# Patient Record
Sex: Male | Born: 1960 | Race: White | Hispanic: No | Marital: Married | Smoking: Never smoker
Health system: Southern US, Community
[De-identification: ages and names within clinical notes are randomized; demographics above are authoritative.]

## PROBLEM LIST (undated history)

## (undated) DIAGNOSIS — M1990 Unspecified osteoarthritis, unspecified site: Secondary | ICD-10-CM

## (undated) DIAGNOSIS — I10 Essential (primary) hypertension: Secondary | ICD-10-CM

## (undated) DIAGNOSIS — B009 Herpesviral infection, unspecified: Secondary | ICD-10-CM

## (undated) HISTORY — PX: TONSILLECTOMY: SUR1361

## (undated) HISTORY — DX: Unspecified osteoarthritis, unspecified site: M19.90

## (undated) HISTORY — DX: Herpesviral infection, unspecified: B00.9

## (undated) HISTORY — PX: COLONOSCOPY: SHX174

## (undated) HISTORY — DX: Essential (primary) hypertension: I10

---

## 2013-04-27 ENCOUNTER — Other Ambulatory Visit (INDEPENDENT_AMBULATORY_CARE_PROVIDER_SITE_OTHER): Payer: 59

## 2013-04-27 DIAGNOSIS — Z0000 Encounter for general adult medical examination without abnormal findings: Secondary | ICD-10-CM

## 2013-04-27 LAB — CBC WITH DIFFERENTIAL/PLATELET
Lymphocytes Relative: 38 % (ref 12–46)
Lymphs Abs: 1.6 10*3/uL (ref 0.7–4.0)
Neutrophils Relative %: 49 % (ref 43–77)
Platelets: 205 10*3/uL (ref 150–400)
RBC: 4.75 MIL/uL (ref 4.22–5.81)
WBC: 4.3 10*3/uL (ref 4.0–10.5)

## 2013-04-27 LAB — COMPREHENSIVE METABOLIC PANEL
ALT: 17 U/L (ref 0–53)
CO2: 30 mEq/L (ref 19–32)
Calcium: 9.4 mg/dL (ref 8.4–10.5)
Chloride: 106 mEq/L (ref 96–112)
Potassium: 5.5 mEq/L — ABNORMAL HIGH (ref 3.5–5.3)
Sodium: 142 mEq/L (ref 135–145)
Total Bilirubin: 0.7 mg/dL (ref 0.3–1.2)
Total Protein: 6.7 g/dL (ref 6.0–8.3)

## 2013-04-27 LAB — PSA: PSA: 0.98 ng/mL (ref <=4.00)

## 2013-04-27 LAB — LIPID PANEL
Cholesterol: 141 mg/dL (ref 0–200)
VLDL: 11 mg/dL (ref 0–40)

## 2013-04-30 ENCOUNTER — Encounter: Payer: Self-pay | Admitting: Family Medicine

## 2013-04-30 ENCOUNTER — Ambulatory Visit (INDEPENDENT_AMBULATORY_CARE_PROVIDER_SITE_OTHER): Payer: 59 | Admitting: Family Medicine

## 2013-04-30 VITALS — BP 100/68 | HR 58 | Temp 98.2°F | Resp 12 | Ht 72.0 in | Wt 163.0 lb

## 2013-04-30 DIAGNOSIS — B009 Herpesviral infection, unspecified: Secondary | ICD-10-CM | POA: Insufficient documentation

## 2013-04-30 DIAGNOSIS — Z1211 Encounter for screening for malignant neoplasm of colon: Secondary | ICD-10-CM

## 2013-04-30 DIAGNOSIS — Z0000 Encounter for general adult medical examination without abnormal findings: Secondary | ICD-10-CM

## 2013-04-30 NOTE — Progress Notes (Signed)
Subjective:    Patient ID: Carlos [name], male    DOB: July 22, 1961, 52 y.o.   MRN: [id number]  HPI  Patient is here today for complete physical exam. He has no specific complaints. He he had his labs drawn prior to appointment. They're listed below: Lab on 04/27/2013  Component Date Value Range Status  . WBC 04/27/2013 4.3  4.0 - 10.5 K/uL Final  . RBC 04/27/2013 4.75  4.22 - 5.81 MIL/uL Final  . Hemoglobin 04/27/2013 14.7  13.0 - 17.0 g/dL Final  . HCT 40/98/1191 42.2  39.0 - 52.0 % Final  . MCV 04/27/2013 88.8  78.0 - 100.0 fL Final  . MCH 04/27/2013 30.9  26.0 - 34.0 pg Final  . MCHC 04/27/2013 34.8  30.0 - 36.0 g/dL Final  . RDW 47/82/9562 13.5  11.5 - 15.5 % Final  . Platelets 04/27/2013 205  150 - 400 K/uL Final  . Neutrophils Relative % 04/27/2013 49  43 - 77 % Final  . Neutro Abs 04/27/2013 2.1  1.7 - 7.7 K/uL Final  . Lymphocytes Relative 04/27/2013 38  12 - 46 % Final  . Lymphs Abs 04/27/2013 1.6  0.7 - 4.0 K/uL Final  . Monocytes Relative 04/27/2013 10  3 - 12 % Final  . Monocytes Absolute 04/27/2013 0.4  0.1 - 1.0 K/uL Final  . Eosinophils Relative 04/27/2013 3  0 - 5 % Final  . Eosinophils Absolute 04/27/2013 0.1  0.0 - 0.7 K/uL Final  . Basophils Relative 04/27/2013 0  0 - 1 % Final  . Basophils Absolute 04/27/2013 0.0  0.0 - 0.1 K/uL Final  . Smear Review 04/27/2013 Criteria for review not met   Final  . Sodium 04/27/2013 142  135 - 145 mEq/L Final  . Potassium 04/27/2013 5.5* 3.5 - 5.3 mEq/L Final   No visible hemolysis.  . Chloride 04/27/2013 106  96 - 112 mEq/L Final  . CO2 04/27/2013 30  19 - 32 mEq/L Final  . Glucose, Bld 04/27/2013 104* 70 - 99 mg/dL Final  . BUN 13/06/6577 13  6 - 23 mg/dL Final  . Creat 46/96/2952 0.85  0.50 - 1.35 mg/dL Final  . Total Bilirubin 04/27/2013 0.7  0.3 - 1.2 mg/dL Final  . Alkaline Phosphatase 04/27/2013 45  39 - 117 U/L Final  . AST 04/27/2013 18  0 - 37 U/L Final  . ALT 04/27/2013 17  0 - 53 U/L Final  . Total Protein  04/27/2013 6.7  6.0 - 8.3 g/dL Final  . Albumin 84/13/2440 4.4  3.5 - 5.2 g/dL Final  . Calcium 09/15/2535 9.4  8.4 - 10.5 mg/dL Final  . Cholesterol 64/40/3474 141  0 - 200 mg/dL Final   Comment: ATP III Classification:                                < 200        mg/dL        Desirable                               200 - 239     mg/dL        Borderline High                               >= 240  mg/dL        High                             . Triglycerides 04/27/2013 57  <150 mg/dL Final  . HDL 47/42/5956 53  >39 mg/dL Final  . Total CHOL/HDL Ratio 04/27/2013 2.7   Final  . VLDL 04/27/2013 11  0 - 40 mg/dL Final  . LDL Cholesterol 04/27/2013 77  0 - 99 mg/dL Final   Comment:                            Total Cholesterol/HDL Ratio:CHD Risk                                                 Coronary Heart Disease Risk Table                                                                 Men       Women                                   1/2 Average Risk              3.4        3.3                                       Average Risk              5.0        4.4                                    2X Average Risk              9.6        7.1                                    3X Average Risk             23.4       11.0                          Use the calculated Patient Ratio above and the CHD Risk table                           to determine the patient's CHD Risk.                          ATP III Classification (LDL):                                <  100        mg/dL         Optimal                               100 - 129     mg/dL         Near or Above Optimal                               130 - 159     mg/dL         Borderline High                               160 - 189     mg/dL         High                                > 190        mg/dL         Very High                             . TSH 04/27/2013 2.512  0.350 - 4.500 uIU/mL Final  . PSA 04/27/2013 0.98  <=4.00 ng/mL Final    Comment: Test Methodology: ECLIA PSA (Electrochemiluminescence Immunoassay)                                                     For PSA values from 2.5-4.0, particularly in younger men <60 years                          old, the AUA and NCCN suggest testing for % Free PSA (3515) and                          evaluation of the rate of increase in PSA (PSA velocity).   Past Medical History  Diagnosis Date  . 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   No current facility-administered medications on file prior to visit.   No Known Allergies History   Social History  . Marital Status: Married    Spouse Name: N/A    Number of Children: N/A  . Years of Education: N/A   Occupational History  . Not on file.   Social History Main Topics  . Smoking status: Never Smoker   . Smokeless tobacco: Not on file  . Alcohol Use: Yes     Comment: Occasional  . Drug Use: No  . Sexually Active: Not on file   Other Topics Concern  . Not on file   Social History Narrative  . No narrative on file   Family History  Problem Relation Age of Onset  . Diabetes Mother   . Vision loss Father   . Cancer Maternal Grandfather      Review of Systems  All other systems reviewed and are negative.  Objective:   Physical Exam  Vitals reviewed. Constitutional: He is oriented to person, place, and time. He appears well-developed and well-nourished. No distress.  HENT:  Head: Normocephalic and atraumatic.  Right Ear: External ear normal.  Left Ear: External ear normal.  Nose: Nose normal.  Mouth/Throat: Oropharynx is clear and moist. No oropharyngeal exudate.  Eyes: Conjunctivae and EOM are normal. Pupils are equal, round, and reactive to light. Right eye exhibits no discharge. Left eye exhibits no discharge. No scleral icterus.  Neck: Normal range of motion. Neck supple. No JVD present. No tracheal deviation present. No thyromegaly present.   Cardiovascular: Normal rate, regular rhythm, normal heart sounds and intact distal pulses.  Exam reveals no gallop and no friction rub.   No murmur heard. Pulmonary/Chest: Effort normal and breath sounds normal. No respiratory distress. He has no wheezes. He has no rales. He exhibits no tenderness.  Abdominal: Soft. Bowel sounds are normal. He exhibits no distension and no mass. There is no tenderness. There is no rebound and no guarding.  Genitourinary: Rectum normal, prostate normal and penis normal. No penile tenderness.  Musculoskeletal: Normal range of motion. He exhibits no edema and no tenderness.  Lymphadenopathy:    He has no cervical adenopathy.  Neurological: He is alert and oriented to person, place, and time. He has normal reflexes. He displays normal reflexes. No cranial nerve deficit. He exhibits normal muscle tone. Coordination normal.  Skin: Skin is warm and dry. No rash noted. He is not diaphoretic. No erythema. No pallor.  Psychiatric: He has a normal mood and affect. His behavior is normal. Judgment and thought content normal.          Assessment & Plan:  1. Routine general medical examination at a health care facility Patient's physical exam was completely normal. His lab work was within normal limits. He did have a small case of ringworm on his right abdomen and I recommended over-the-counter lotrimin to treat this.  Prostate exam was normal.  Anticipatory guidance was provided.  2. Screening for colon cancer Consult GI for a screening colonoscopy. - Ambulatory referral to Gastroenterology

## 2013-05-07 ENCOUNTER — Encounter: Payer: Self-pay | Admitting: Internal Medicine

## 2013-07-02 ENCOUNTER — Ambulatory Visit (AMBULATORY_SURGERY_CENTER): Payer: 59

## 2013-07-02 VITALS — Ht 72.0 in | Wt 154.6 lb

## 2013-07-02 DIAGNOSIS — Z1211 Encounter for screening for malignant neoplasm of colon: Secondary | ICD-10-CM

## 2013-07-02 MED ORDER — SUPREP BOWEL PREP KIT 17.5-3.13-1.6 GM/177ML PO SOLN: 1.0000 | Freq: Once | ORAL | Status: DC

## 2013-07-16 ENCOUNTER — Encounter: Payer: Self-pay | Admitting: Internal Medicine

## 2013-08-04 ENCOUNTER — Encounter: Payer: Self-pay | Admitting: Internal Medicine

## 2013-08-04 ENCOUNTER — Ambulatory Visit (AMBULATORY_SURGERY_CENTER): Payer: 59 | Admitting: Internal Medicine

## 2013-08-04 VITALS — BP 103/62 | HR 61 | Temp 97.9°F | Resp 51 | Ht 72.0 in | Wt 154.0 lb

## 2013-08-04 DIAGNOSIS — Z1211 Encounter for screening for malignant neoplasm of colon: Secondary | ICD-10-CM

## 2013-08-04 MED ORDER — SODIUM CHLORIDE 0.9 % IV SOLN: 500.0000 mL | INTRAVENOUS | Status: DC

## 2013-08-04 NOTE — Progress Notes (Signed)
Report to pacu rn, vss, bbs=clear 

## 2013-08-04 NOTE — Patient Instructions (Addendum)
Your colonoscopy was normal and the prep was excellent.  Next routine colonoscopy in 10 years - 2024  I appreciate the opportunity to care for you.  Iva Boop, MD, FACG  YOU HAD AN ENDOSCOPIC PROCEDURE TODAY AT THE [facility]: Refer to the procedure report that was given to you for any specific questions about what was found during the examination.  If the procedure report does not answer your questions, please call your gastroenterologist to clarify.  If you requested that your care partner not be given the details of your procedure findings, then the procedure report has been included in a sealed envelope for you to review at your convenience later.  YOU SHOULD EXPECT: Some feelings of bloating in the abdomen. Passage of more gas than usual.  Walking can help get rid of the air that was put into your GI tract during the procedure and reduce the bloating. If you had a lower endoscopy (such as a colonoscopy or flexible sigmoidoscopy) you may notice spotting of blood in your stool or on the toilet paper. If you underwent a bowel prep for your procedure, then you may not have a normal bowel movement for a few days.  DIET: Your first meal following the procedure should be a light meal and then it is ok to progress to your normal diet.  A half-sandwich or bowl of soup is an example of a good first meal.  Heavy or fried foods are harder to digest and may make you feel nauseous or bloated.  Likewise meals heavy in dairy and vegetables can cause extra gas to form and this can also increase the bloating.  Drink plenty of fluids but you should avoid alcoholic beverages for 24 hours.  ACTIVITY: Your care partner should take you home directly after the procedure.  You should plan to take it easy, moving slowly for the rest of the day.  You can resume normal activity the day after the procedure however you should NOT DRIVE or use heavy machinery for 24 hours (because of the sedation medicines  used during the test).    SYMPTOMS TO REPORT IMMEDIATELY: A gastroenterologist can be reached at any hour.  During normal business hours, 8:30 AM to 5:00 PM Monday through Friday, call [phone].  After hours and on weekends, please call the GI answering service at [phone] who will take a message and have the physician on call contact you.   Following lower endoscopy (colonoscopy or flexible sigmoidoscopy):  Excessive amounts of blood in the stool  Significant tenderness or worsening of abdominal pains  Swelling of the abdomen that is new, acute  Fever of 100F or higher  FOLLOW UP: If any biopsies were taken you will be contacted by phone or by letter within the next 1-3 weeks.  Call your gastroenterologist if you have not heard about the biopsies in 3 weeks.  Our staff will call the home number listed on your records the next business day following your procedure to check on you and address any questions or concerns that you may have at that time regarding the information given to you following your procedure. This is a courtesy call and so if there is no answer at the home number and we have not heard from you through the emergency physician on call, we will assume that you have returned to your regular daily activities without incident.  SIGNATURES/CONFIDENTIALITY: You and/or your care partner have signed paperwork which will be entered into your  electronic medical record.  These signatures attest to the fact that that the information above on your After Visit Summary has been reviewed and is understood.  Full responsibility of the confidentiality of this discharge information lies with you and/or your care-partner.

## 2013-08-04 NOTE — Op Note (Signed)
[facility] [address] [place], [address]   COLONOSCOPY PROCEDURE REPORT  PATIENT: Carlos, [name]  MR#: [id number] BIRTHDATE: 06/18/1961 , 52  yrs. old GENDER: Male ENDOSCOPIST: Iva Boop, MD, [facility] REFERRED UV:[id number] Tanya Nones, M.D. PROCEDURE DATE:  08/04/2013 PROCEDURE:   Colonoscopy, screening First Screening Colonoscopy - Avg.  risk and is 50 yrs.  old or older Yes.  Prior Negative Screening - Now for repeat screening. N/A  History of Adenoma - Now for follow-up colonoscopy & has been > or = to 3 yrs.  N/A  Polyps Removed Today? No.  Recommend repeat exam, <10 yrs? No. ASA CLASS:   Class I INDICATIONS:average risk screening and first colonoscopy. MEDICATIONS: Propofol (Diprivan) 230 mg IV, MAC sedation, administered by CRNA, and These medications were titrated to patient response per physician's verbal order  DESCRIPTION OF PROCEDURE:   After the risks benefits and alternatives of the procedure were thoroughly explained, informed consent was obtained.  A digital rectal exam revealed no abnormalities of the rectum, A digital rectal exam revealed no prostatic nodules, and A digital rectal exam revealed the prostate was not enlarged.   The LB [id number]  endoscope was introduced through the anus and advanced to the cecum, which was identified by both the appendix and ileocecal valve. No adverse events experienced.   The quality of the prep was excellent using Suprep  The instrument was then slowly withdrawn as the colon was fully examined.      COLON FINDINGS: A normal appearing cecum, ileocecal valve, and appendiceal orifice were identified.  The ascending, hepatic flexure, transverse, splenic flexure, descending, sigmoid colon and rectum appeared unremarkable.  No polyps or cancers were seen.   A right colon retroflexion was performed.  Retroflexed views revealed no abnormalities. The time to cecum=2 minutes 29 seconds. Withdrawal time=9  minutes 16 seconds.  The scope was withdrawn and the procedure completed. COMPLICATIONS: There were no complications.  ENDOSCOPIC IMPRESSION: Normal colonoscopy  RECOMMENDATIONS: Repeat routine colonscopy in 10 years - 2024. Annual hemoccults not indicated in the interim.   eSigned:  Iva Boop, MD, [facility] 08/04/2013 11:43 AM   cc: Lynnea Ferrier, MD and The Patient

## 2013-08-04 NOTE — Progress Notes (Signed)
Patient did not have preoperative order for IV antibiotic SSI prophylaxis. (G8918)  Patient did not experience any of the following events: a burn prior to discharge; a fall within the facility; wrong site/side/patient/procedure/implant event; or a hospital transfer or hospital admission upon discharge from the facility. (G8907)  

## 2013-08-05 ENCOUNTER — Telehealth: Payer: Self-pay | Admitting: *Deleted

## 2013-08-05 NOTE — Telephone Encounter (Signed)
  Follow up Call-  Call back number 08/04/2013  Post procedure Call Back phone  # [phone]  Permission to leave phone message Yes     Patient questions:  Do you have a fever, pain , or abdominal swelling? no Pain Score  0 *  Have you tolerated food without any problems? yes  Have you been able to return to your normal activities? yes  Do you have any questions about your discharge instructions: Diet   no Medications  no Follow up visit  no  Do you have questions or concerns about your Care? no  Actions: * If pain score is 4 or above: No action needed, pain <4.

## 2013-12-28 ENCOUNTER — Encounter: Payer: Self-pay | Admitting: Family Medicine

## 2013-12-28 ENCOUNTER — Ambulatory Visit (INDEPENDENT_AMBULATORY_CARE_PROVIDER_SITE_OTHER): Payer: 59 | Admitting: Family Medicine

## 2013-12-28 VITALS — BP 120/80 | HR 68 | Temp 98.3°F | Resp 18 | Ht 71.0 in | Wt 158.0 lb

## 2013-12-28 DIAGNOSIS — R202 Paresthesia of skin: Secondary | ICD-10-CM

## 2013-12-28 DIAGNOSIS — R209 Unspecified disturbances of skin sensation: Secondary | ICD-10-CM

## 2013-12-28 DIAGNOSIS — S41119A Laceration without foreign body of unspecified upper arm, initial encounter: Secondary | ICD-10-CM

## 2013-12-28 DIAGNOSIS — S41109A Unspecified open wound of unspecified upper arm, initial encounter: Secondary | ICD-10-CM

## 2013-12-28 NOTE — Assessment & Plan Note (Signed)
His sutures will need to stay in for at least 10 days. He has an appointment with orthopedics tomorrow secondary to the paresthesias in his hand and the location of the laceration. His tetanus is up to date he will complete antibiotics he does have pain medication. I will defer to work note to see when they will like to remove the sutures which we can gladly do here in the office if need

## 2013-12-28 NOTE — Progress Notes (Signed)
Patient ID: Carlos [name], male   DOB: Jul 22, 1961, 53 y.o.   MRN: [id number]     Subjective:    Patient ID: Carlos [name], male    DOB: [birth year], 53 y.o.   MRN: [id number]  Patient presents for check stitches  patient was seen by [facility] system one February 7 after he had an accident with a chainsaw. He was out cutting some one when the chainsaw caught his left arm and elbow causing a large laceration. An x-ray was done which did not show any fracture to the elbow or arm. He did have significant swelling however he was sutured at [facility] and told to follow with his PCP in orthopedics. He does have some tingling and numbness in his left hand and he has decreased range of motion in the elbow he is unable to extend completely or benf to 90 secondary to the sutures. His tetanus booster was given in may of 2013 he is currently on Bactrim antibiotics. He does have hydrocodone but has not used this however he is using the Flexeril that was given    Review Of Systems: PER ABOVE  GEN- denies fatigue, fever, weight loss,weakness, recent illness HEENT- denies eye drainage, change in vision, nasal discharge, CVS- denies chest pain, palpitations RESP- denies SOB, cough, wheeze MSK- +joint pain Neuro- denies headache, dizziness, syncope, seizure activity       Objective:    BP 120/80  Pulse 68  Temp(Src) 98.3 F (36.8 C) (Oral)  Resp 18  Ht 5\' 11"  (1.803 m)  Wt 158 lb (71.668 kg)  BMI 22.05 kg/m2 GEN- NAD, alert and oriented x3 Skin- large laceration extending from left forearm to arm- through center of elbow, +erythema, mild warmth, +swelling. MSK- decreased ROM left elbow, Normal ROM wrist/fingers, sensation grossly in tact Pulses- Radial 2+, fingers warm to touch        Assessment & Plan:      Problem List Items Addressed This Visit   None      Note: This dictation was prepared with Dragon dictation along with smaller phrase technology. Any  transcriptional errors that result from this process are unintentional.

## 2013-12-28 NOTE — Patient Instructions (Signed)
Continue current antibiotics F/u with orthopedics at 9:15am - Dr. Ninfa Linden--- [facility]

## 2013-12-28 NOTE — Assessment & Plan Note (Signed)
I think that this may be temporary do to all the swelling that is in his elbow which is probably compressing some of the nerves , this may be tincture of time to see if he has any permanent damage

## 2014-05-27 ENCOUNTER — Ambulatory Visit (INDEPENDENT_AMBULATORY_CARE_PROVIDER_SITE_OTHER): Payer: BC Managed Care – PPO | Admitting: Family Medicine

## 2014-05-27 ENCOUNTER — Other Ambulatory Visit: Payer: BC Managed Care – PPO

## 2014-05-27 ENCOUNTER — Encounter: Payer: Self-pay | Admitting: Family Medicine

## 2014-05-27 ENCOUNTER — Other Ambulatory Visit: Payer: Self-pay | Admitting: Family Medicine

## 2014-05-27 VITALS — BP 132/76 | HR 62 | Temp 97.6°F | Resp 14

## 2014-05-27 DIAGNOSIS — Z0000 Encounter for general adult medical examination without abnormal findings: Secondary | ICD-10-CM

## 2014-05-27 DIAGNOSIS — T7840XA Allergy, unspecified, initial encounter: Secondary | ICD-10-CM

## 2014-05-27 LAB — COMPREHENSIVE METABOLIC PANEL
ALK PHOS: 54 U/L (ref 39–117)
ALT: 36 U/L (ref 0–53)
AST: 31 U/L (ref 0–37)
Albumin: 4.3 g/dL (ref 3.5–5.2)
BILIRUBIN TOTAL: 0.4 mg/dL (ref 0.2–1.2)
BUN: 11 mg/dL (ref 6–23)
CO2: 28 meq/L (ref 19–32)
CREATININE: 0.79 mg/dL (ref 0.50–1.35)
Calcium: 9.2 mg/dL (ref 8.4–10.5)
Chloride: 104 mEq/L (ref 96–112)
GLUCOSE: 105 mg/dL — AB (ref 70–99)
Potassium: 5 mEq/L (ref 3.5–5.3)
SODIUM: 140 meq/L (ref 135–145)
TOTAL PROTEIN: 6.5 g/dL (ref 6.0–8.3)

## 2014-05-27 LAB — CBC WITH DIFFERENTIAL/PLATELET
BASOS PCT: 0 % (ref 0–1)
Basophils Absolute: 0 10*3/uL (ref 0.0–0.1)
EOS ABS: 0.1 10*3/uL (ref 0.0–0.7)
EOS PCT: 2 % (ref 0–5)
HCT: 43.3 % (ref 39.0–52.0)
Hemoglobin: 15.2 g/dL (ref 13.0–17.0)
Lymphocytes Relative: 27 % (ref 12–46)
Lymphs Abs: 1.5 10*3/uL (ref 0.7–4.0)
MCH: 30.7 pg (ref 26.0–34.0)
MCHC: 35.1 g/dL (ref 30.0–36.0)
MCV: 87.5 fL (ref 78.0–100.0)
Monocytes Absolute: 0.5 10*3/uL (ref 0.1–1.0)
Monocytes Relative: 9 % (ref 3–12)
NEUTROS PCT: 62 % (ref 43–77)
Neutro Abs: 3.3 10*3/uL (ref 1.7–7.7)
PLATELETS: 203 10*3/uL (ref 150–400)
RBC: 4.95 MIL/uL (ref 4.22–5.81)
RDW: 13.4 % (ref 11.5–15.5)
WBC: 5.4 10*3/uL (ref 4.0–10.5)

## 2014-05-27 LAB — LIPID PANEL
CHOL/HDL RATIO: 2.7 ratio
CHOLESTEROL: 130 mg/dL (ref 0–200)
HDL: 48 mg/dL (ref >39)
LDL CALC: 71 mg/dL (ref 0–99)
TRIGLYCERIDES: 55 mg/dL (ref <150)
VLDL: 11 mg/dL (ref 0–40)

## 2014-05-27 MED ORDER — EPINEPHRINE 0.3 MG/0.3ML IJ SOAJ: 0.3000 mg | Freq: Once | INTRAMUSCULAR | Status: DC

## 2014-05-27 MED ORDER — METHYLPREDNISOLONE ACETATE 80 MG/ML IJ SUSP
80.0000 mg | Freq: Once | INTRAMUSCULAR | Status: AC
Start: 2014-05-27 — End: 2014-05-27
  Administered 2014-05-27: 80 mg via INTRAMUSCULAR

## 2014-05-27 MED ORDER — DIPHENHYDRAMINE HCL 50 MG/ML IJ SOLN
50.0000 mg | Freq: Once | INTRAMUSCULAR | Status: AC
  Administered 2014-05-27: 50 mg via INTRAVENOUS

## 2014-05-27 MED ORDER — PREDNISONE 20 MG PO TABS: ORAL_TABLET | ORAL | Status: DC

## 2014-05-27 NOTE — Progress Notes (Signed)
   Subjective:    Patient ID: Carlos [name], male    DOB: [birth year], 53 y.o.   MRN: [id number]  HPI The patient was tunneled the dorsum of his left hand earlier this morning by a wasp.  He now has significant erythema, swelling, pitting edema on the surface of his left hand from his fingertips to his proximal wrist. The hand is grossly swollen and painful. He denies any difficulty swallowing. He denies any difficulty breathing. He is not wheezing. He denies any stridor. He is not taking any medication. Past Medical History  Diagnosis Date  . 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 Marital Status: Married    Spouse Name: N/A    Number of Children: N/A  . Years of Education: N/A   Occupational History  . Not on file.   Social History Main Topics  . Smoking status: Never Smoker   . Smokeless tobacco: Not on file  . Alcohol Use: Yes     Comment: Occasional  . Drug Use: No  . Sexual Activity: Not on file   Other Topics Concern  . Not on file   Social History Narrative  . No narrative on file     Review of Systems  All other systems reviewed and are negative.      Objective:   Physical Exam  Vitals reviewed. Constitutional: He appears well-developed and well-nourished.  Cardiovascular: Normal rate, regular rhythm and normal heart sounds.   No murmur heard. Pulmonary/Chest: Breath sounds normal. No respiratory distress. He has no wheezes. He has no rales.  Abdominal: Soft. Bowel sounds are normal. He exhibits no distension. There is no tenderness. There is no rebound.  Musculoskeletal: He exhibits edema.  Skin: Skin is warm. Rash noted. There is erythema.          Assessment & Plan:  1. Allergic reaction, initial encounter Patient received Depo-Medrol 80 mg IM x1 and Benadryl 50 mg IM x1.  I recommended he apply ice to his hand for 15 minutes 3 times a day. I recommended he take  Benadryl 25 mg every 4 hours until rash improves. Also recommend he start a prednisone taper pack tomorrow. Recheck in 24 hours if no better or immediately if worse. Also gave the patient a prescription for an EpiPen for him to have a home in case he has a anaphylactic reaction in the future. - methylPREDNISolone acetate (DEPO-MEDROL) injection 80 mg; Inject 1 mL (80 mg total) into the muscle once. - diphenhydrAMINE (BENADRYL) injection 50 mg; Inject 1 mL (50 mg total) into the vein once.

## 2014-05-28 LAB — PSA: PSA: 1.81 ng/mL (ref <=4.00)

## 2014-05-31 ENCOUNTER — Encounter: Payer: Self-pay | Admitting: Family Medicine

## 2014-05-31 ENCOUNTER — Ambulatory Visit (INDEPENDENT_AMBULATORY_CARE_PROVIDER_SITE_OTHER): Payer: BC Managed Care – PPO | Admitting: Family Medicine

## 2014-05-31 VITALS — BP 106/64 | HR 58 | Temp 97.4°F | Resp 12 | Ht 72.0 in | Wt 163.0 lb

## 2014-05-31 DIAGNOSIS — Z0000 Encounter for general adult medical examination without abnormal findings: Secondary | ICD-10-CM

## 2014-05-31 NOTE — Progress Notes (Signed)
Subjective:    Patient ID: Carlos [name], male    DOB: 1961/07/24, 53 y.o.   MRN: [id number]  HPI Patient is complete physical exam. He has no concerns. His lab work is excellent except that his PSA has doubled over the last 12 months.  He is completely asymptomatic. However his family history is concerning in that his father had prostate cancer at age 24.  Otherwise his preventive care is up to date. He had a colonoscopy last year that was completely normal. His vaccinations are up to date. His most recent labwork as listed below: Lab on 05/27/2014  Component Date Value Ref Range Status  . WBC 05/27/2014 5.4  4.0 - 10.5 K/uL Final  . RBC 05/27/2014 4.95  4.22 - 5.81 MIL/uL Final  . Hemoglobin 05/27/2014 15.2  13.0 - 17.0 g/dL Final  . HCT 05/27/2014 43.3  39.0 - 52.0 % Final  . MCV 05/27/2014 87.5  78.0 - 100.0 fL Final  . MCH 05/27/2014 30.7  26.0 - 34.0 pg Final  . MCHC 05/27/2014 35.1  30.0 - 36.0 g/dL Final  . RDW 05/27/2014 13.4  11.5 - 15.5 % Final  . Platelets 05/27/2014 203  150 - 400 K/uL Final  . Neutrophils Relative % 05/27/2014 62  43 - 77 % Final  . Neutro Abs 05/27/2014 3.3  1.7 - 7.7 K/uL Final  . Lymphocytes Relative 05/27/2014 27  12 - 46 % Final  . Lymphs Abs 05/27/2014 1.5  0.7 - 4.0 K/uL Final  . Monocytes Relative 05/27/2014 9  3 - 12 % Final  . Monocytes Absolute 05/27/2014 0.5  0.1 - 1.0 K/uL Final  . Eosinophils Relative 05/27/2014 2  0 - 5 % Final  . Eosinophils Absolute 05/27/2014 0.1  0.0 - 0.7 K/uL Final  . Basophils Relative 05/27/2014 0  0 - 1 % Final  . Basophils Absolute 05/27/2014 0.0  0.0 - 0.1 K/uL Final  . Smear Review 05/27/2014 Criteria for review not met   Final  . Sodium 05/27/2014 140  135 - 145 mEq/L Final  . Potassium 05/27/2014 5.0  3.5 - 5.3 mEq/L Final  . Chloride 05/27/2014 104  96 - 112 mEq/L Final  . CO2 05/27/2014 28  19 - 32 mEq/L Final  . Glucose, Bld 05/27/2014 105* 70 - 99 mg/dL Final  . BUN 05/27/2014 11  6 - 23 mg/dL  Final  . Creat 05/27/2014 0.79  0.50 - 1.35 mg/dL Final  . Total Bilirubin 05/27/2014 0.4  0.2 - 1.2 mg/dL Final  . Alkaline Phosphatase 05/27/2014 54  39 - 117 U/L Final  . AST 05/27/2014 31  0 - 37 U/L Final  . ALT 05/27/2014 36  0 - 53 U/L Final  . Total Protein 05/27/2014 6.5  6.0 - 8.3 g/dL Final  . Albumin 05/27/2014 4.3  3.5 - 5.2 g/dL Final  . Calcium 05/27/2014 9.2  8.4 - 10.5 mg/dL Final  . Cholesterol 05/27/2014 130  0 - 200 mg/dL Final   Comment: ATP III Classification:                                < 200        mg/dL        Desirable                               200 -  239     mg/dL        Borderline High                               >= 240        mg/dL        High                             . Triglycerides 05/27/2014 55  <150 mg/dL Final  . HDL 05/27/2014 48  >39 mg/dL Final  . Total CHOL/HDL Ratio 05/27/2014 2.7   Final  . VLDL 05/27/2014 11  0 - 40 mg/dL Final  . LDL Cholesterol 05/27/2014 71  0 - 99 mg/dL Final   Comment:                            Total Cholesterol/HDL Ratio:CHD Risk                                                 Coronary Heart Disease Risk Table                                                                 Men       Women                                   1/2 Average Risk              3.4        3.3                                       Average Risk              5.0        4.4                                    2X Average Risk              9.6        7.1                                    3X Average Risk             23.4       11.0                          Use the calculated Patient Ratio above and the CHD Risk table  to determine the patient's CHD Risk.                          ATP III Classification (LDL):                                < 100        mg/dL         Optimal                               100 - 129     mg/dL         Near or Above Optimal                               130 - 159     mg/dL          Borderline High                               160 - 189     mg/dL         High                                > 190        mg/dL         Very High                             . PSA 05/27/2014 1.81  <=4.00 ng/mL Final   Comment: Test Methodology: ECLIA PSA (Electrochemiluminescence Immunoassay)                                                     For PSA values from 2.5-4.0, particularly in younger men <60 years                          old, the AUA and NCCN suggest testing for % Free PSA (3515) and                          evaluation of the rate of increase in PSA (PSA velocity).   Past Medical History  Diagnosis Date  . 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 Tonsillectomy     Current Outpatient Prescriptions on File Prior to Visit  Medication Sig Dispense Refill  . EPINEPHrine (EPIPEN) 0.3 mg/0.3 mL IJ SOAJ injection Inject 0.3 mLs (0.3 mg total) into the muscle once.  1 Device  0  . predniSONE (DELTASONE) 20 MG tablet 3 tabs poqday 1-2, 2 tabs poqday 3-4, 1 tab poqday 5-6  12 tablet  0   No current facility-administered medications on file prior to visit.   No Known Allergies History   Social History  . Marital Status: Married    Spouse Name: N/A    Number of Children: N/A  .  Years of Education: N/A   Occupational History  . Not on file.   Social History Main Topics  . Smoking status: Never Smoker   . Smokeless tobacco: Never Used  . Alcohol Use: Yes     Comment: Occasional  . Drug Use: No  . Sexual Activity: Yes   Other Topics Concern  . Not on file   Social History Narrative  . No narrative on file   Family History  Problem Relation Age of Onset  . Diabetes Mother   . Vision loss Father   . Cancer Father 75    prostate  . Cancer Maternal Grandfather   . Colon cancer Neg Hx       Review of Systems  All other systems reviewed and are negative.      Objective:   Physical Exam  Vitals  reviewed. Constitutional: He is oriented to person, place, and time. He appears well-developed and well-nourished. No distress.  HENT:  Head: Normocephalic and atraumatic.  Right Ear: External ear normal.  Left Ear: External ear normal.  Nose: Nose normal.  Mouth/Throat: Oropharynx is clear and moist. No oropharyngeal exudate.  Eyes: Conjunctivae and EOM are normal. Pupils are equal, round, and reactive to light. Right eye exhibits no discharge. Left eye exhibits no discharge. No scleral icterus.  Neck: Normal range of motion. Neck supple. No JVD present. No tracheal deviation present. No thyromegaly present.  Cardiovascular: Normal rate, regular rhythm, normal heart sounds and intact distal pulses.  Exam reveals no gallop and no friction rub.   No murmur heard. Pulmonary/Chest: Effort normal and breath sounds normal. No stridor. No respiratory distress. He has no wheezes. He has no rales. He exhibits no tenderness.  Abdominal: Soft. Bowel sounds are normal. He exhibits no distension and no mass. There is no tenderness. There is no rebound and no guarding.  Genitourinary: Rectum normal, prostate normal and penis normal.  Musculoskeletal: Normal range of motion. He exhibits no edema and no tenderness.  Lymphadenopathy:    He has no cervical adenopathy.  Neurological: He is alert and oriented to person, place, and time. He has normal reflexes. He displays normal reflexes. No cranial nerve deficit. He exhibits normal muscle tone. Coordination normal.  Skin: Skin is warm. No rash noted. He is not diaphoretic. No erythema. No pallor.  Psychiatric: He has a normal mood and affect. His behavior is normal. Judgment and thought content normal.   prostate exam is completely normal. There is no nodularity or asymmetry        Assessment & Plan:  1. Routine general medical examination at a health care facility Physical exam is completely normal. Cancer screening is up to date. Regular anticipatory  guidance is provided. I am concerned by his family history and the fact that his PSA has doubled in one year. However his prostate exam is completely normal today. Therefore, I will recheck the patient in 3 months. At that time I will repeat a PSA and a percent free PSA.  His PSA continues to rise, I recommend a urology consultation.

## 2014-06-04 ENCOUNTER — Ambulatory Visit: Payer: BC Managed Care – PPO | Admitting: Family Medicine

## 2014-07-22 ENCOUNTER — Ambulatory Visit
Admission: RE | Admit: 2014-07-22 | Discharge: 2014-07-22 | Disposition: A | Discharge status: Home / Self Care | Payer: BC Managed Care – PPO | Source: Ambulatory Visit | Attending: Physician Assistant | Admitting: Physician Assistant

## 2014-07-22 ENCOUNTER — Encounter: Payer: Self-pay | Admitting: Family Medicine

## 2014-07-22 ENCOUNTER — Ambulatory Visit (INDEPENDENT_AMBULATORY_CARE_PROVIDER_SITE_OTHER): Payer: BC Managed Care – PPO | Admitting: Physician Assistant

## 2014-07-22 ENCOUNTER — Encounter: Payer: Self-pay | Admitting: Physician Assistant

## 2014-07-22 VITALS — BP 116/70 | HR 64 | Temp 97.9°F | Resp 12 | Ht 73.0 in | Wt 174.0 lb

## 2014-07-22 DIAGNOSIS — R0789 Other chest pain: Secondary | ICD-10-CM

## 2014-07-22 DIAGNOSIS — I208 Other forms of angina pectoris: Secondary | ICD-10-CM

## 2014-07-22 DIAGNOSIS — K219 Gastro-esophageal reflux disease without esophagitis: Secondary | ICD-10-CM

## 2014-07-22 DIAGNOSIS — R0602 Shortness of breath: Secondary | ICD-10-CM

## 2014-07-22 DIAGNOSIS — I209 Angina pectoris, unspecified: Secondary | ICD-10-CM

## 2014-07-22 MED ORDER — OMEPRAZOLE 20 MG PO CPDR: 20.0000 mg | DELAYED_RELEASE_CAPSULE | Freq: Every day | ORAL | Status: DC

## 2014-07-22 MED ORDER — NITROGLYCERIN 0.4 MG SL SUBL: 0.4000 mg | SUBLINGUAL_TABLET | SUBLINGUAL | Status: DC | PRN

## 2014-07-22 NOTE — Progress Notes (Signed)
Patient ID: Carlos [name] MRN: [id number], DOB: [birth year], 53 y.o. Date of Encounter: @DATE @  Chief Complaint:  Chief Complaint  Patient presents with  . Chest Congestion    x1 week- states that he has slight cough in the morning and has thick mucus, states he has difficulty breathing at night with congestion- also reports that he has had increased edema to BLE    HPI: 53 y.o. year old male  presents with below follwing symptoms.   When I went in the room I had read the above chief complaint. Asked him about nasal congestion/mucus and chest congestion/cough he says now he hasn't been having that.  He says that when he is lying in bed at nigh,t if he lies on his left side he feels pressure in his chest. However when lying in other positions he has not noticed the pressure in his chest.  He also says that sometimes after work he will sit on the couch with his feet propped up and will feel the pressure in his chest at that time.   He reports that this past Saturday he was in the pool swimming for about 10 minutes and then when he stopped he felt pressure in his chest and felt short of breath. Asked if he swims on a routine basis and he says that the last time prior to this past Saturday,  He had been swimming was the Saturday prior. He says that when he had been swimming the prior Saturday he felt fine. I asked what other physical exertion he does-- he says that he walks his dog for 45 minutes twice a day every day. Says over the past week while he's been doing his walking he has felt some chest pressure and shortness of breath. Says that he sometimes will make himself take a deep breath and it will relieve some of that sensation but he is able to continue his walk and has not had to stop because of the symptoms.  He also says that he has noticed some swelling in his ankles over this past week from his sock line. When I examined ankles today there is no significant edema. He makes the  comment that it is better today.   He states he has had no radiation of the discomfort up his neck or down his left arm. Had no associated diaphoresis nausea or vomiting. However, he does say that sometimes he feels like a chest tightness is going up into his neck "and feels like there is some problem in his neck as well." He has not felt or tasted acid in his throat and has not had other "heartburn symptoms."  Has Not noticed any of these symptoms occurring after eating/drinking.   Past Medical History  Diagnosis Date  . 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 EPINEPHrine (EPIPEN) 0.3 mg/0.3 mL IJ SOAJ injection Inject 0.3 mLs (0.3 mg total) into the muscle once.  1 Device  0  . predniSONE (DELTASONE) 20 MG tablet 3 tabs poqday 1-2, 2 tabs poqday 3-4, 1 tab poqday 5-6  12 tablet  0   No facility-administered medications prior to visit.    Allergies:  Allergies  Allergen Reactions  . Bee Venom     History   Social History  . Marital Status: Married    Spouse Name: N/A    Number of Children: N/A  .  Years of Education: N/A   Occupational History  . Not on file.   Social History Main Topics  . Smoking status: Never Smoker   . Smokeless tobacco: Never Used  . Alcohol Use: Yes     Comment: Occasional  . Drug Use: No  . Sexual Activity: Yes   Other Topics Concern  . Not on file   Social History Narrative  . No narrative on file    Family History  Problem Relation Age of Onset  . Diabetes Mother   . Vision loss Father   . Cancer Father 29    prostate  . Cancer Maternal Grandfather   . Colon cancer Neg Hx      Review of Systems:  See HPI for pertinent ROS. All other ROS negative.    Physical Exam: Blood pressure 116/70, pulse 64, temperature 97.9 F (36.6 C), temperature source Oral, resp. rate 12, height 6\' 1"  (1.854 m), weight 174 lb (78.926 kg)., Body mass index is 22.96  kg/(m^2). General: WNWD Male. Appears in no acute distress. Neck: Supple. No thyromegaly. No lymphadenopathy. Lungs: Clear bilaterally to auscultation without wheezes, rales, or rhonchi. Breathing is unlabored. Heart: RRR with S1 S2. No murmurs, rubs, or gallops. Abdomen: Soft, non-tender, non-distended with normoactive bowel sounds. No hepatomegaly. No rebound/guarding. No obvious abdominal masses. Musculoskeletal:  Strength and tone normal for age. There is no tenderness with palpation of the chest wall, including the peristernal area. Extremities/Skin: Warm and dry.  No edema.  Neuro: Alert and oriented X 3. Moves all extremities spontaneously. Gait is normal. CNII-XII grossly in tact. Psych:  Responds to questions appropriately with a normal affect.    EKG shows sinus bradycardia 51 beats per minutes nonspecific ST-T changes.  ASSESSMENT AND PLAN:  53 y.o. year old male with  1. Exertional angina - Ambulatory referral to Cardiology - nitroGLYCERIN (NITROSTAT) 0.4 MG SL tablet; Place 1 tablet (0.4 mg total) under the tongue every 5 (five) minutes as needed for chest pain.  Dispense: 25 tablet; Refill: 0  2. Gastroesophageal reflux disease, esophagitis presence not specified - omeprazole (PRILOSEC) 20 MG capsule; Take 1 capsule (20 mg total) by mouth daily.  Dispense: 30 capsule; Refill: 3  3. Chest pressure - DG Chest 2 View; Future - Ambulatory referral to Cardiology - omeprazole (PRILOSEC) 20 MG capsule; Take 1 capsule (20 mg total) by mouth daily.  Dispense: 30 capsule; Refill: 3 - nitroGLYCERIN (NITROSTAT) 0.4 MG SL tablet; Place 1 tablet (0.4 mg total) under the tongue every 5 (five) minutes as needed for chest pain.  Dispense: 25 tablet; Refill: 0  4. Shortness of breath - DG Chest 2 View; Future - Ambulatory referral to Cardiology - nitroGLYCERIN (NITROSTAT) 0.4 MG SL tablet; Place 1 tablet (0.4 mg total) under the tongue every 5 (five) minutes as needed for chest pain.   Dispense: 25 tablet; Refill: 0  Some of his symptoms are consistent with exertional angina and some sound like GERD or even musculoskeletal chest wall pain. However he has no tenderness with palpation of his chest wall so do not think that is the cause.  EKG shows no ischemic change.  Will obtain chest x-ray. Will obtain referral to cardiology for followup. Prescribed omeprazole for him to start taking one daily and gave him nitroglycerin to use if needed. Instructed to him proper use of the Ntg. Schedule followup office visit here for about 3 weeks to followup after the above evaluation.  Also discussed indications for going to the emergency  Room.  SignedOlean Ree Sawgrass, [place], [facility] 07/22/2014 1:34 PM

## 2014-08-23 ENCOUNTER — Ambulatory Visit (INDEPENDENT_AMBULATORY_CARE_PROVIDER_SITE_OTHER): Payer: BC Managed Care – PPO | Admitting: Internal Medicine

## 2014-08-23 ENCOUNTER — Encounter: Payer: Self-pay | Admitting: Internal Medicine

## 2014-08-23 VITALS — BP 116/80 | HR 62 | Ht 72.0 in | Wt 169.2 lb

## 2014-08-23 DIAGNOSIS — K219 Gastro-esophageal reflux disease without esophagitis: Secondary | ICD-10-CM | POA: Insufficient documentation

## 2014-08-23 DIAGNOSIS — R0789 Other chest pain: Secondary | ICD-10-CM

## 2014-08-23 NOTE — Patient Instructions (Signed)
Your physician has requested that you have an exercise tolerance test. For further information please visit [email or web address]. Please also follow instruction sheet, as given.  Your physician recommends that you schedule a follow-up appointment AS NEEDED.  We will call you with your stress test results.

## 2014-08-23 NOTE — Progress Notes (Signed)
OFFICE NOTE  Chief Complaint:  Chest pain  Primary Care Physician: Odette Fraction, MD  HPI:  Carlos [name] is a pleasant 53 year old [place] male originally from [place], [place]. He is an Recruitment consultant and recently has been having problems with chest pain. He feels like a bloating sensation which is somewhat worse after he lays down at night. He did have one episode this summer where he went swimming after eating and had some discomfort in his chest which improved when he stopped exercising. The symptoms were getting worse recently. He was seen in his primary care provider's office and prescribed omeprazole and has been taking that on a daily basis. He feels that he's had marked improvement in his symptoms and they've completely gone away. He denies however having any typical reflux symptoms such as regurgitating food, belching, burning in his throat or chest or other associated symptoms. He has been able to exercise although recently he has done less exercise than typical. He still walks and talks for about an hour almost every day. There is no significant family history of coronary disease.  PMHx:  Past Medical History  Diagnosis Date  . 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 Tonsillectomy      FAMHx:  Family History  Problem Relation Age of Onset  . Diabetes Mother   . Vision loss Father   . Cancer Father 58    prostate  . Cancer Maternal Grandfather   . Colon cancer Neg Hx     SOCHx:   reports that he has never smoked. He has never used smokeless tobacco. He reports that he drinks alcohol. He reports that he does not use illicit drugs.  ALLERGIES:  Allergies  Allergen Reactions  . Bee Venom     ROS: A comprehensive review of systems was negative except for: Cardiovascular: positive for chest pain Gastrointestinal: positive for reflux symptoms  HOME  MEDS: Current Outpatient Prescriptions  Medication Sig Dispense Refill  . EPINEPHrine (EPIPEN) 0.3 mg/0.3 mL IJ SOAJ injection Inject 0.3 mLs (0.3 mg total) into the muscle once.  1 Device  0  . nitroGLYCERIN (NITROSTAT) 0.4 MG SL tablet Place 1 tablet (0.4 mg total) under the tongue every 5 (five) minutes as needed for chest pain.  25 tablet  0  . omeprazole (PRILOSEC) 20 MG capsule Take 1 capsule (20 mg total) by mouth daily.  30 capsule  3   No current facility-administered medications for this visit.    LABS/IMAGING: No results found for this or any previous visit (from the past 48 hour(s)). No results found.  VITALS: BP 116/80  Pulse 62  Ht 6' (1.829 m)  Wt 169 lb 3.2 oz (76.749 kg)  BMI 22.94 kg/m2  EXAM: General appearance: alert and no distress Neck: no carotid bruit and no JVD Lungs: clear to auscultation bilaterally Heart: regular rate and rhythm, S1, S2 normal, no murmur, click, rub or gallop Abdomen: soft, non-tender; bowel sounds normal; no masses,  no organomegaly Extremities: extremities normal, atraumatic, no cyanosis or edema Pulses: 2+ and symmetric Skin: Skin color, texture, turgor normal. No rashes or lesions Neurologic: Grossly normal Psych: Normal  EKG: deferred  ASSESSMENT: 1. Atypical chest pain, suspect GERD  PLAN: 1.   Carlos [name] describes chest discomfort which is improved with omeprazole although he is doing less exercise. Some of it was exercise related.  He has few cardiac risk factors. I suspect that this is most likely reflux, however an exercise treadmill stress test is reasonable given his age. He is agreeable to this. We will contact him with the results of his treadmill stress test. If negative he can followup as needed.  Thank you as always for this kind referral.  Pixie Casino, MD, [facility] Attending Cardiologist [facility]  Tammy Wickliffe C 08/23/2014, 12:59 PM

## 2014-08-27 ENCOUNTER — Other Ambulatory Visit: Payer: BC Managed Care – PPO

## 2014-08-27 DIAGNOSIS — R972 Elevated prostate specific antigen [PSA]: Secondary | ICD-10-CM

## 2014-08-28 LAB — PSA: PSA: 1.08 ng/mL (ref <=4.00)

## 2014-08-28 LAB — PSA, FREE
PSA FREE: 0.5 ng/mL
PSA, Free Pct: 46 % (ref >25)

## 2014-08-31 ENCOUNTER — Encounter: Payer: Self-pay | Admitting: Family Medicine

## 2014-08-31 ENCOUNTER — Ambulatory Visit (INDEPENDENT_AMBULATORY_CARE_PROVIDER_SITE_OTHER): Payer: BC Managed Care – PPO | Admitting: Family Medicine

## 2014-08-31 VITALS — BP 104/62 | HR 62 | Temp 98.3°F | Resp 12 | Ht 72.0 in | Wt 170.0 lb

## 2014-08-31 DIAGNOSIS — R972 Elevated prostate specific antigen [PSA]: Secondary | ICD-10-CM

## 2014-08-31 NOTE — Progress Notes (Signed)
   Subjective:    Patient ID: Carlos [name], male    DOB: 09/25/1961, 53 y.o.   MRN: [id number]  HPI Patient is a very pleasant 53 year old gentleman who I last saw this summer for complete physical exam. At that time his PSA had gone from 0.98-1.8. His prostate exam was normal however his father has a history of prostate cancer and therefore, we were definitely more concerned by this drastic rise in his PSA.  He remains asymptomatic. I checked his PSA which has fallen to 1.08. Furthermore 46% and this was free PSA making prostate cancer very unlikely. The patient's pain was sure by this. Furthermore he has not had any further chest pain since this month. He continues to take omeprazole 20 mg by mouth daily. He is completely asymptomatic. He questions whether or not he needs this medication. He is scheduled for a stress test later this month. Past Medical History  Diagnosis Date  . 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 Tonsillectomy     Current Outpatient Prescriptions on File Prior to Visit  Medication Sig Dispense Refill  . EPINEPHrine (EPIPEN) 0.3 mg/0.3 mL IJ SOAJ injection Inject 0.3 mLs (0.3 mg total) into the muscle once.  1 Device  0  . nitroGLYCERIN (NITROSTAT) 0.4 MG SL tablet Place 1 tablet (0.4 mg total) under the tongue every 5 (five) minutes as needed for chest pain.  25 tablet  0  . omeprazole (PRILOSEC) 20 MG capsule Take 1 capsule (20 mg total) by mouth daily.  30 capsule  3   No current facility-administered medications on file prior to visit.   Allergies  Allergen Reactions  . Bee Venom    History   Social History  . Marital Status: Married    Spouse Name: N/A    Number of Children: N/A  . Years of Education: N/A   Occupational History  . Not on file.   Social History Main Topics  . Smoking status: Never Smoker   . Smokeless tobacco: Never Used  . Alcohol Use: Yes     Comment: Occasional  . Drug Use: No  .  Sexual Activity: Yes   Other Topics Concern  . Not on file   Social History Narrative  . No narrative on file      Review of Systems  All other systems reviewed and are negative.      Objective:   Physical Exam  Vitals reviewed. Cardiovascular: Normal rate, regular rhythm and normal heart sounds.   No murmur heard. Pulmonary/Chest: Effort normal and breath sounds normal. No respiratory distress. He has no wheezes. He has no rales.  Abdominal: Soft. Bowel sounds are normal. He exhibits no distension. There is no tenderness. There is no rebound.          Assessment & Plan:  Elevated PSA  I believe we can lengthen the patient's surveillance back to one year. I am very reassured by the fall in his PSA. I have recommended that he discontinue omeprazole and resume the medication only if his symptoms return after he discontinues the medicine.

## 2014-09-08 ENCOUNTER — Telehealth (HOSPITAL_COMMUNITY): Payer: Self-pay

## 2014-09-08 NOTE — Telephone Encounter (Signed)
Encounter complete. 

## 2014-09-10 ENCOUNTER — Ambulatory Visit (HOSPITAL_COMMUNITY)
Admission: RE | Admit: 2014-09-10 | Discharge: 2014-09-10 | Disposition: A | Discharge status: Home / Self Care | Payer: BC Managed Care – PPO | Source: Ambulatory Visit | Attending: Cardiovascular Disease | Admitting: Cardiovascular Disease

## 2014-09-10 ENCOUNTER — Telehealth (HOSPITAL_COMMUNITY): Payer: Self-pay

## 2014-09-10 DIAGNOSIS — R0789 Other chest pain: Secondary | ICD-10-CM

## 2014-09-10 DIAGNOSIS — R079 Chest pain, unspecified: Secondary | ICD-10-CM | POA: Insufficient documentation

## 2014-09-10 NOTE — Telephone Encounter (Signed)
Encounter complete. 

## 2014-09-10 NOTE — Procedures (Signed)
Exercise Treadmill Test  Pre-Exercise Testing Evaluation  NSR, normal tracing  Test  Exercise Tolerance Test Ordering MD: Pixie Casino, MD  Interpreting MD:   Unique Test No: 1  Treadmill:  1  Indication for ETT: chest pain - rule out ischemia  Contraindication to ETT: No   Stress Modality: exercise - treadmill  Cardiac Imaging Performed: non   Protocol: standard Bruce - maximal  Max BP:  176/60  Max MPHR (bpm): 168 85% MPR (bpm):  142  MPHR obtained (bpm):  169 % MPHR obtained: 100  Reached 85% MPHR (min:sec):  9:40 Total Exercise Time (min-sec): 12:08  Workload in METS:  13.60 Borg Scale:   Reason ETT Terminated:  fatigue    ST Segment Analysis At Rest: normal ST segments - no evidence of significant ST depression With Exercise: no evidence of significant ST depression  Other Information Arrhythmia:  occasional PVCs and a single ventricular couplet, rare PACs Angina during ETT:  absent (0) Quality of ETT:  diagnostic  ETT Interpretation:  normal - no evidence of ischemia by ST analysis  Comments: Good exercise tolerance. Normotensive response to exercise.  Sanda Klein, MD, [facility] [phone] office [phone] pager   **

## 2014-09-14 NOTE — Progress Notes (Signed)
LMTCB for stress test results

## 2015-08-05 ENCOUNTER — Other Ambulatory Visit: Payer: BLUE CROSS/BLUE SHIELD

## 2015-08-05 DIAGNOSIS — K219 Gastro-esophageal reflux disease without esophagitis: Secondary | ICD-10-CM

## 2015-08-05 DIAGNOSIS — Z79899 Other long term (current) drug therapy: Secondary | ICD-10-CM

## 2015-08-05 DIAGNOSIS — Z0000 Encounter for general adult medical examination without abnormal findings: Secondary | ICD-10-CM

## 2015-08-05 LAB — CBC WITH DIFFERENTIAL/PLATELET
BASOS ABS: 0 10*3/uL (ref 0.0–0.1)
Basophils Relative: 0 % (ref 0–1)
EOS ABS: 0.1 10*3/uL (ref 0.0–0.7)
Eosinophils Relative: 3 % (ref 0–5)
HEMATOCRIT: 45.4 % (ref 39.0–52.0)
Hemoglobin: 15.7 g/dL (ref 13.0–17.0)
LYMPHS ABS: 1.6 10*3/uL (ref 0.7–4.0)
Lymphocytes Relative: 33 % (ref 12–46)
MCH: 31.3 pg (ref 26.0–34.0)
MCHC: 34.6 g/dL (ref 30.0–36.0)
MCV: 90.6 fL (ref 78.0–100.0)
MPV: 9.4 fL (ref 8.6–12.4)
Monocytes Absolute: 0.4 10*3/uL (ref 0.1–1.0)
Monocytes Relative: 9 % (ref 3–12)
Neutro Abs: 2.6 10*3/uL (ref 1.7–7.7)
Neutrophils Relative %: 55 % (ref 43–77)
PLATELETS: 225 10*3/uL (ref 150–400)
RBC: 5.01 MIL/uL (ref 4.22–5.81)
RDW: 13.4 % (ref 11.5–15.5)
WBC: 4.7 10*3/uL (ref 4.0–10.5)

## 2015-08-05 LAB — LIPID PANEL
Cholesterol: 144 mg/dL (ref 125–200)
HDL: 54 mg/dL (ref >=40)
LDL Cholesterol: 81 mg/dL (ref <130)
Total CHOL/HDL Ratio: 2.7 Ratio (ref <=5.0)
Triglycerides: 46 mg/dL (ref <150)
VLDL: 9 mg/dL (ref <30)

## 2015-08-05 LAB — COMPLETE METABOLIC PANEL WITH GFR
ALT: 17 U/L (ref 9–46)
AST: 21 U/L (ref 10–35)
Albumin: 4.3 g/dL (ref 3.6–5.1)
Alkaline Phosphatase: 47 U/L (ref 40–115)
BUN: 15 mg/dL (ref 7–25)
CHLORIDE: 104 mmol/L (ref 98–110)
CO2: 27 mmol/L (ref 20–31)
CREATININE: 0.81 mg/dL (ref 0.70–1.33)
Calcium: 8.7 mg/dL (ref 8.6–10.3)
GFR, Est African American: >89 mL/min (ref >=60)
GFR, Est Non African American: >89 mL/min (ref >=60)
GLUCOSE: 100 mg/dL — AB (ref 70–99)
POTASSIUM: 4.6 mmol/L (ref 3.5–5.3)
SODIUM: 141 mmol/L (ref 135–146)
Total Bilirubin: 0.5 mg/dL (ref 0.2–1.2)
Total Protein: 6.3 g/dL (ref 6.1–8.1)

## 2015-08-06 LAB — TSH: TSH: 1.461 u[IU]/mL (ref 0.350–4.500)

## 2015-08-09 ENCOUNTER — Ambulatory Visit (INDEPENDENT_AMBULATORY_CARE_PROVIDER_SITE_OTHER): Payer: BLUE CROSS/BLUE SHIELD | Admitting: Family Medicine

## 2015-08-09 ENCOUNTER — Encounter: Payer: Self-pay | Admitting: Family Medicine

## 2015-08-09 VITALS — BP 100/62 | HR 62 | Temp 98.3°F | Resp 12 | Ht 72.0 in | Wt 168.0 lb

## 2015-08-09 DIAGNOSIS — Z0000 Encounter for general adult medical examination without abnormal findings: Secondary | ICD-10-CM

## 2015-08-09 DIAGNOSIS — Z23 Encounter for immunization: Secondary | ICD-10-CM | POA: Diagnosis not present

## 2015-08-09 NOTE — Progress Notes (Signed)
Subjective:    Patient ID: Carlos [name], male    DOB: 09-20-1961, 54 y.o.   MRN: [id number]  HPI  Patient is here for a complete physical exam. He has no concerns. We did not check a PSA yet as it has been not quit 1 year.  However digital rectal exam is performed today and is completely normal. His PSA in October 2015 was 1.08.  Otherwise his preventive care is up to date. He had a colonoscopy 2014 that was completely normal. His vaccinations are up to date. His most recent labwork as listed below: Lab on 08/05/2015  Component Date Value Ref Range Status  . Sodium 08/05/2015 141  135 - 146 mmol/L Final  . Potassium 08/05/2015 4.6  3.5 - 5.3 mmol/L Final  . Chloride 08/05/2015 104  98 - 110 mmol/L Final  . CO2 08/05/2015 27  20 - 31 mmol/L Final  . Glucose, Bld 08/05/2015 100* 70 - 99 mg/dL Final  . BUN 08/05/2015 15  7 - 25 mg/dL Final  . Creat 08/05/2015 0.81  0.70 - 1.33 mg/dL Final  . Total Bilirubin 08/05/2015 0.5  0.2 - 1.2 mg/dL Final  . Alkaline Phosphatase 08/05/2015 47  40 - 115 U/L Final  . AST 08/05/2015 21  10 - 35 U/L Final  . ALT 08/05/2015 17  9 - 46 U/L Final  . Total Protein 08/05/2015 6.3  6.1 - 8.1 g/dL Final  . Albumin 08/05/2015 4.3  3.6 - 5.1 g/dL Final  . Calcium 08/05/2015 8.7  8.6 - 10.3 mg/dL Final  . GFR, Est African American 08/05/2015 >89  >=60 mL/min Final  . GFR, Est Non African American 08/05/2015 >89  >=60 mL/min Final   Comment:   The estimated GFR is a calculation valid for adults (>=48 years old) that uses the CKD-EPI algorithm to adjust for age and sex. It is   not to be used for children, pregnant women, hospitalized patients,    patients on dialysis, or with rapidly changing kidney function. According to the NKDEP, eGFR >89 is normal, 60-89 shows mild impairment, 30-59 shows moderate impairment, 15-29 shows severe impairment and <15 is ESRD.     . TSH 08/05/2015 1.461  0.350 - 4.500 uIU/mL Final   Comment:   Footnotes:  (1) **  Please note change in unit of measure and reference range(s). **     . Cholesterol 08/05/2015 144  125 - 200 mg/dL Final  . Triglycerides 08/05/2015 46  <150 mg/dL Final  . HDL 08/05/2015 54  >=40 mg/dL Final  . Total CHOL/HDL Ratio 08/05/2015 2.7  <=5.0 Ratio Final  . VLDL 08/05/2015 9  <30 mg/dL Final  . LDL Cholesterol 08/05/2015 81  <130 mg/dL Final   Comment:   Total Cholesterol/HDL Ratio:CHD Risk                        Coronary Heart Disease Risk Table                                        Men       Women          1/2 Average Risk              3.4        3.3              Average  Risk              5.0        4.4           2X Average Risk              9.6        7.1           3X Average Risk             23.4       11.0 Use the calculated Patient Ratio above and the CHD Risk table  to determine the patient's CHD Risk.   . WBC 08/05/2015 4.7  4.0 - 10.5 K/uL Final  . RBC 08/05/2015 5.01  4.22 - 5.81 MIL/uL Final  . Hemoglobin 08/05/2015 15.7  13.0 - 17.0 g/dL Final  . HCT 08/05/2015 45.4  39.0 - 52.0 % Final  . MCV 08/05/2015 90.6  78.0 - 100.0 fL Final  . MCH 08/05/2015 31.3  26.0 - 34.0 pg Final  . MCHC 08/05/2015 34.6  30.0 - 36.0 g/dL Final  . RDW 08/05/2015 13.4  11.5 - 15.5 % Final  . Platelets 08/05/2015 225  150 - 400 K/uL Final  . MPV 08/05/2015 9.4  8.6 - 12.4 fL Final  . Neutrophils Relative % 08/05/2015 55  43 - 77 % Final  . Neutro Abs 08/05/2015 2.6  1.7 - 7.7 K/uL Final  . Lymphocytes Relative 08/05/2015 33  12 - 46 % Final  . Lymphs Abs 08/05/2015 1.6  0.7 - 4.0 K/uL Final  . Monocytes Relative 08/05/2015 9  3 - 12 % Final  . Monocytes Absolute 08/05/2015 0.4  0.1 - 1.0 K/uL Final  . Eosinophils Relative 08/05/2015 3  0 - 5 % Final  . Eosinophils Absolute 08/05/2015 0.1  0.0 - 0.7 K/uL Final  . Basophils Relative 08/05/2015 0  0 - 1 % Final  . Basophils Absolute 08/05/2015 0.0  0.0 - 0.1 K/uL Final  . Smear Review 08/05/2015 Criteria for review not met    Final   Past Medical History  Diagnosis Date  . 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 Tonsillectomy     No current outpatient prescriptions on file prior to visit.   No current facility-administered medications on file prior to visit.   Allergies  Allergen Reactions  . Bee Venom    Social History   Social History  . Marital Status: Married    Spouse Name: N/A  . Number of Children: N/A  . Years of Education: N/A   Occupational History  . Not on file.   Social History Main Topics  . Smoking status: Never Smoker   . Smokeless tobacco: Never Used  . Alcohol Use: Yes     Comment: Occasional  . Drug Use: No  . Sexual Activity: Yes   Other Topics Concern  . Not on file   Social History Narrative   Family History  Problem Relation Age of Onset  . Diabetes Mother   . Vision loss Father   . Cancer Father 71    prostate  . Cancer Maternal Grandfather   . Colon cancer Neg Hx       Review of Systems  All other systems reviewed and are negative.      Objective:   Physical Exam  Constitutional: He is oriented to person, place, and time. He appears well-developed and well-nourished. No  distress.  HENT:  Head: Normocephalic and atraumatic.  Right Ear: External ear normal.  Left Ear: External ear normal.  Nose: Nose normal.  Mouth/Throat: Oropharynx is clear and moist. No oropharyngeal exudate.  Eyes: Conjunctivae and EOM are normal. Pupils are equal, round, and reactive to light. Right eye exhibits no discharge. Left eye exhibits no discharge. No scleral icterus.  Neck: Normal range of motion. Neck supple. No JVD present. No tracheal deviation present. No thyromegaly present.  Cardiovascular: Normal rate, regular rhythm, normal heart sounds and intact distal pulses.  Exam reveals no gallop and no friction rub.   No murmur heard. Pulmonary/Chest: Effort normal and breath sounds normal. No stridor. No respiratory  distress. He has no wheezes. He has no rales. He exhibits no tenderness.  Abdominal: Soft. Bowel sounds are normal. He exhibits no distension and no mass. There is no tenderness. There is no rebound and no guarding.  Genitourinary: Rectum normal, prostate normal and penis normal.  Musculoskeletal: Normal range of motion. He exhibits no edema or tenderness.  Lymphadenopathy:    He has no cervical adenopathy.  Neurological: He is alert and oriented to person, place, and time. He has normal reflexes. No cranial nerve deficit. He exhibits normal muscle tone. Coordination normal.  Skin: Skin is warm. No rash noted. He is not diaphoretic. No erythema. No pallor.  Psychiatric: He has a normal mood and affect. His behavior is normal. Judgment and thought content normal.  Vitals reviewed.  prostate exam is completely normal. There is no nodularity or asymmetry        Assessment & Plan:  1. Routine general medical examination at a health care facility Physical exam is completely normal. Cancer screening is up to date. Regular anticipatory guidance is provided. Patient received his flu shot today. He can come in anytime after October for PSA. However I am reassured by his normal exam.

## 2015-08-09 NOTE — Addendum Note (Signed)
Addended by: Shary Decamp B on: 08/09/2015 04:46 PM   Modules accepted: Orders

## 2015-10-17 ENCOUNTER — Ambulatory Visit (INDEPENDENT_AMBULATORY_CARE_PROVIDER_SITE_OTHER): Payer: BLUE CROSS/BLUE SHIELD | Admitting: Physician Assistant

## 2015-10-17 ENCOUNTER — Encounter: Payer: Self-pay | Admitting: Physician Assistant

## 2015-10-17 VITALS — BP 122/80 | HR 68 | Temp 99.0°F | Resp 18 | Wt 177.0 lb

## 2015-10-17 DIAGNOSIS — B349 Viral infection, unspecified: Secondary | ICD-10-CM

## 2015-10-17 DIAGNOSIS — J988 Other specified respiratory disorders: Principal | ICD-10-CM

## 2015-10-17 DIAGNOSIS — B9789 Other viral agents as the cause of diseases classified elsewhere: Secondary | ICD-10-CM

## 2015-10-17 MED ORDER — HYDROCODONE-HOMATROPINE 5-1.5 MG/5ML PO SYRP: 5.0000 mL | ORAL_SOLUTION | Freq: Three times a day (TID) | ORAL | Status: DC | PRN

## 2015-10-18 NOTE — Progress Notes (Signed)
    Patient ID: Auguster Eftink MRN: [id number], DOB: 02-19-1961, 54 y.o. Date of Encounter: 10/18/2015, 11:22 AM    Chief Complaint:  Chief Complaint  Patient presents with  . sick x 1 day    fever, cough ,congestion     HPI: 54 y.o. year old white male states that symptoms just started yesterday. Says that last night he coughed a lot and was unable to get much sleep because of this. Also having runny nose. Some headache. Has had no known fever no chills. No significant sore throat or earache.     Home Meds:   No outpatient prescriptions prior to visit.   No facility-administered medications prior to visit.    Allergies:  Allergies  Allergen Reactions  . Bee Venom       Review of Systems: See HPI for pertinent ROS. All other ROS negative.    Physical Exam: Blood pressure 122/80, pulse 68, temperature 99 F (37.2 C), temperature source Oral, resp. rate 18, weight 177 lb (80.287 kg)., Body mass index is 24 kg/(m^2). General: WNWD WM. Appears in no acute distress. HEENT: Normocephalic, atraumatic, eyes without discharge, sclera non-icteric, nares are without discharge. Bilateral auditory canals clear, TM's are without perforation, pearly grey and translucent with reflective cone of light bilaterally. Oral cavity moist, posterior pharynx without exudate, erythema, peritonsillar abscess. No tenderness with percussion of frontal or maxillary sinuses.  Neck: Supple. No thyromegaly. No lymphadenopathy. Lungs: Clear bilaterally to auscultation without wheezes, rales, or rhonchi. Breathing is unlabored. Heart: Regular rhythm. No murmurs, rubs, or gallops. Msk:  Strength and tone normal for age. Extremities/Skin: Warm and dry. Neuro: Alert and oriented X 3. Moves all extremities spontaneously. Gait is normal. CNII-XII grossly in tact. Psych:  Responds to questions appropriately with a normal affect.     ASSESSMENT AND PLAN:  54 y.o. year old male with  1. Viral respiratory  infection Discussed with him that most likely this is a virus and if that is the case it should run its course and resolve on its own in 7-10 days. Told him that if symptoms worsen significantly or develops increased fever (currently 99 here in the office) or if symptoms persist greater than 7-10 days then follow-up. Will give him cough suppressant to use for symptom relief in the interim. - HYDROcodone-homatropine (HYCODAN) 5-1.5 MG/5ML syrup; Take 5 mLs by mouth every 8 (eight) hours as needed for cough.  Dispense: 120 mL; Refill: 0   Signed, [address] [place], [place], [facility] 10/18/2015 11:22 AM

## 2015-10-20 ENCOUNTER — Encounter: Payer: Self-pay | Admitting: Physician Assistant

## 2015-10-20 ENCOUNTER — Encounter: Payer: Self-pay | Admitting: Family Medicine

## 2015-10-20 ENCOUNTER — Telehealth: Payer: Self-pay | Admitting: Family Medicine

## 2015-10-20 ENCOUNTER — Ambulatory Visit (INDEPENDENT_AMBULATORY_CARE_PROVIDER_SITE_OTHER): Payer: BLUE CROSS/BLUE SHIELD | Admitting: Physician Assistant

## 2015-10-20 VITALS — BP 112/70 | HR 68 | Temp 98.3°F | Resp 18 | Wt 172.0 lb

## 2015-10-20 DIAGNOSIS — J988 Other specified respiratory disorders: Secondary | ICD-10-CM | POA: Diagnosis not present

## 2015-10-20 DIAGNOSIS — B9689 Other specified bacterial agents as the cause of diseases classified elsewhere: Principal | ICD-10-CM

## 2015-10-20 MED ORDER — AZITHROMYCIN 250 MG PO TABS: ORAL_TABLET | ORAL | Status: DC

## 2015-10-20 NOTE — Progress Notes (Signed)
    Patient ID: Carlos [name] MRN: [id number], DOB: 06/23/1961, 54 y.o. Date of Encounter: 10/20/2015, 12:06 PM    Chief Complaint:  Chief Complaint  Patient presents with  . still sick from earlier week    now with dizziness, HA, lethargic     HPI: 54 y.o. year old white male    10/17/2015: states that symptoms just started yesterday. Says that last night he coughed a lot and was unable to get much sleep because of this. Also having runny nose. Some headache. Has had no known fever no chills. No significant sore throat or earache.  At that visit .with viral respiratory infection. Gave Hycodan to use for cough suppressant.  Today 10/20/15: He states that he is continued with the basically the same symptoms but that they are just getting worse and definitely not better. He had to stay in bed yesterday because he felt so bad. Also had to stay out of work yesterday and today. Still with a lot of cough as well as some head and nasal congestion. No significant sore throat and no known fevers or chills.     Home Meds:   Outpatient Prescriptions Prior to Visit  Medication Sig Dispense Refill  . HYDROcodone-homatropine (HYCODAN) 5-1.5 MG/5ML syrup Take 5 mLs by mouth every 8 (eight) hours as needed for cough. 120 mL 0   No facility-administered medications prior to visit.    Allergies:  Allergies  Allergen Reactions  . Bee Venom       Review of Systems: See HPI for pertinent ROS. All other ROS negative.    Physical Exam: Blood pressure 112/70, pulse 68, temperature 98.3 F (36.8 C), temperature source Oral, resp. rate 18, weight 172 lb (78.019 kg)., Body mass index is 23.32 kg/(m^2). General: WNWD WM. Appears in no acute distress. HEENT: Normocephalic, atraumatic, eyes without discharge, sclera non-icteric, nares are without discharge. Bilateral auditory canals clear, TM's are without perforation, pearly grey and translucent with reflective cone of light bilaterally. Oral  cavity moist, posterior pharynx without exudate, erythema, peritonsillar abscess. No tenderness with percussion of frontal or maxillary sinuses.  Neck: Supple. No thyromegaly. No lymphadenopathy. Lungs: Clear bilaterally to auscultation without wheezes, rales, or rhonchi. Breathing is unlabored. Heart: Regular rhythm. No murmurs, rubs, or gallops. Msk:  Strength and tone normal for age. Extremities/Skin: Warm and dry. Neuro: Alert and oriented X 3. Moves all extremities spontaneously. Gait is normal. CNII-XII grossly in tact. Psych:  Responds to questions appropriately with a normal affect.     ASSESSMENT AND PLAN:  54 y.o. year old male with   1. Bacterial respiratory infection He is to take antibiotic as directed. Continue Hycodan as needed for cough suppressant. Note given to cover since Tuesday afternoon through Friday to be out of work. Return to work Monday. Follow-up if symptoms worsen or do not resolve within 1 week after completion of this antibiotic. - azithromycin (ZITHROMAX) 250 MG tablet; Day 1: Take 2 daily.  Days 2-5: Take 1 daily.  Dispense: 6 tablet; Refill: 0   Signed, [address] [place], [place], [facility] 10/20/2015 12:06 PM

## 2016-02-24 ENCOUNTER — Encounter: Payer: Self-pay | Admitting: Family Medicine

## 2016-02-24 ENCOUNTER — Other Ambulatory Visit: Payer: Self-pay | Admitting: *Deleted

## 2016-02-24 ENCOUNTER — Ambulatory Visit (HOSPITAL_COMMUNITY): Admission: RE | Admit: 2016-02-24 | Payer: Self-pay | Source: Ambulatory Visit

## 2016-02-24 ENCOUNTER — Ambulatory Visit (INDEPENDENT_AMBULATORY_CARE_PROVIDER_SITE_OTHER): Payer: BLUE CROSS/BLUE SHIELD | Admitting: Family Medicine

## 2016-02-24 ENCOUNTER — Ambulatory Visit (HOSPITAL_COMMUNITY)
Admission: RE | Admit: 2016-02-24 | Discharge: 2016-02-24 | Disposition: A | Discharge status: Home / Self Care | Payer: BLUE CROSS/BLUE SHIELD | Source: Ambulatory Visit | Attending: Family Medicine | Admitting: Family Medicine

## 2016-02-24 VITALS — BP 126/84 | HR 64 | Temp 98.7°F | Resp 18 | Wt 179.0 lb

## 2016-02-24 DIAGNOSIS — M79604 Pain in right leg: Secondary | ICD-10-CM | POA: Insufficient documentation

## 2016-02-24 DIAGNOSIS — I8391 Asymptomatic varicose veins of right lower extremity: Secondary | ICD-10-CM | POA: Diagnosis not present

## 2016-02-24 DIAGNOSIS — M7989 Other specified soft tissue disorders: Secondary | ICD-10-CM

## 2016-02-24 MED ORDER — DICLOFENAC SODIUM 75 MG PO TBEC: 75.0000 mg | DELAYED_RELEASE_TABLET | Freq: Two times a day (BID) | ORAL | Status: DC

## 2016-02-24 NOTE — Patient Instructions (Signed)
Get ultrasound done ASAP  We will call with lab results  F/U pending results

## 2016-02-24 NOTE — Progress Notes (Signed)
Patient ID: Carlos [name], male   DOB: September 11, 1961, 55 y.o.   MRN: [id number]    Subjective:    Patient ID: Carlos [name], male    DOB: [birth year], 34 y.o.   MRN: [id number]  Patient presents for swollen rt lower leg x 1 week Is here with right leg swelling for the past week. He actually returned from a trip to [place] on March 13. One week ago he was playing soccer and felt a severe cramp in his leg afterwards he had significant swelling he states that he was fine between the time that he came home from [place] until a week ago. He used ice and try to elevate but there is no change in the swelling. He denies any shortness of breath no chest pain. He did use a compression wrap yesterday which is helped when he is ambulating   Review Of Systems:  GEN- denies fatigue, fever, weight loss,weakness, recent illness HEENT- denies eye drainage, change in vision, nasal discharge, CVS- denies chest pain, palpitations RESP- denies SOB, cough, wheeze ABD- denies N/V, change in stools, abd pain GU- denies dysuria, hematuria, dribbling, incontinence MSK- denies joint pain, muscle aches, injury Neuro- denies headache, dizziness, syncope, seizure activity       Objective:    BP 126/84 mmHg  Pulse 64  Temp(Src) 98.7 F (37.1 C) (Oral)  Resp 18  Wt 179 lb (81.194 kg) GEN- NAD, alert and oriented x3 CVS- RRR, no murmur RESP-CTAB Ext- Right leg- swelling from calf to foot, +homans, TTP along calf, bruising noted on calf and foot, +varicose veins, no tender superficial veins  LLE- No swelling FROM ankle        Assessment & Plan:      Problem List Items Addressed This Visit    None    Visit Diagnoses    Leg swelling    -  Primary    Concern for DVT with recent trip, STAT US obtained and negative,? muscle injury in calf( possible tear) will start diclofenac BID,ICE, elevate, recheck next week, may need ortho     Relevant Orders    US Venous Img Lower Unilateral Right (Completed)       Note: This dictation was prepared with Dragon dictation along with smaller phrase technology. Any transcriptional errors that result from this process are unintentional.

## 2016-02-29 ENCOUNTER — Ambulatory Visit (INDEPENDENT_AMBULATORY_CARE_PROVIDER_SITE_OTHER): Payer: BLUE CROSS/BLUE SHIELD | Admitting: Family Medicine

## 2016-02-29 ENCOUNTER — Encounter: Payer: Self-pay | Admitting: Family Medicine

## 2016-02-29 VITALS — BP 122/64 | HR 62 | Temp 98.1°F | Resp 16 | Ht 72.0 in | Wt 182.0 lb

## 2016-02-29 DIAGNOSIS — S8991XD Unspecified injury of right lower leg, subsequent encounter: Secondary | ICD-10-CM

## 2016-02-29 DIAGNOSIS — S86811D Strain of other muscle(s) and tendon(s) at lower leg level, right leg, subsequent encounter: Secondary | ICD-10-CM | POA: Diagnosis not present

## 2016-02-29 NOTE — Patient Instructions (Addendum)
Take the anti-inflammatory  Ice your leg and elevate Continue with compression wrap Give note for work returning today  F/U as needed

## 2016-02-29 NOTE — Progress Notes (Signed)
Patient ID: Carlos [name], male   DOB: 03/22/1961, 55 y.o.   MRN: [id number]   Subjective:    Patient ID: Carlos [name], male    DOB: [birth year], 23 y.o.   MRN: [id number]  Patient presents for F/U Patient for recheck on his right leg swelling. He was seen last Friday secondary to swelling of his legs were weak. He has to return from [place] recently but was playing soccer when he noticed a severe cramp in his leg and then he had some bruising and swelling. I sent him for ultrasound there is no evidence of DVT or phlebitis. He was started on diclofenac 75 mg twice a day also advised ice and elevate the leg. Past 2 days his swelling started to go down. He is able to ambulate he has some pain when he walks up steps or walking up hills. But he's been using his compression wrap and this helps the most.     Review Of Systems:  GEN- denies fatigue, fever, weight loss,weakness, recent illness HEENT- denies eye drainage, change in vision, nasal discharge, CVS- denies chest pain, palpitations RESP- denies SOB, cough, wheeze ABD- denies N/V, change in stools, abd pain GU- denies dysuria, hematuria, dribbling, incontinence MSK- denies joint pain, muscle aches, injury Neuro- denies headache, dizziness, syncope, seizure activity       Objective:    BP 122/64 mmHg  Pulse 62  Temp(Src) 98.1 F (36.7 C) (Oral)  Resp 16  Ht 6' (1.829 m)  Wt 182 lb (82.555 kg)  BMI 24.68 kg/m2 GEN- NAD, alert and oriented x3 Ext- Right leg- swelling from calf to foot, +homans, TTP along calf, bruising noted on calf and foot, +varicose veins, no tender superficial veins  LLE- No swelling FROM ankle Pulse-       Assessment & Plan:      Problem List Items Addressed This Visit    None    Visit Diagnoses    Right leg injury, subsequent encounter    -  Primary    Strain of calf muscle, right, subsequent encounter        I think this is his fever strain or small tear in his calf muscle when he was playing  soccer. No evidence of DVT. He is improving with anti-inflammatories, RICE therapy Expect that this should be resolved in next 2-4 weeks. Discussed that if he has worsening pain on changes then referral to  orthopedics.        Note: This dictation was prepared with Dragon dictation along with smaller phrase technology. Any transcriptional errors that result from this process are unintentional.

## 2016-08-17 ENCOUNTER — Other Ambulatory Visit: Payer: BLUE CROSS/BLUE SHIELD

## 2016-08-17 DIAGNOSIS — Z0000 Encounter for general adult medical examination without abnormal findings: Secondary | ICD-10-CM | POA: Diagnosis not present

## 2016-08-17 DIAGNOSIS — Z125 Encounter for screening for malignant neoplasm of prostate: Secondary | ICD-10-CM | POA: Diagnosis not present

## 2016-08-17 LAB — COMPLETE METABOLIC PANEL WITH GFR
ALT: 17 U/L (ref 9–46)
AST: 21 U/L (ref 10–35)
Albumin: 4.2 g/dL (ref 3.6–5.1)
Alkaline Phosphatase: 44 U/L (ref 40–115)
BUN: 16 mg/dL (ref 7–25)
CALCIUM: 8.6 mg/dL (ref 8.6–10.3)
CHLORIDE: 105 mmol/L (ref 98–110)
CO2: 29 mmol/L (ref 20–31)
CREATININE: 0.75 mg/dL (ref 0.70–1.33)
Glucose, Bld: 103 mg/dL — ABNORMAL HIGH (ref 70–99)
POTASSIUM: 4.2 mmol/L (ref 3.5–5.3)
Sodium: 141 mmol/L (ref 135–146)
Total Bilirubin: 0.4 mg/dL (ref 0.2–1.2)
Total Protein: 6.5 g/dL (ref 6.1–8.1)

## 2016-08-17 LAB — CBC WITH DIFFERENTIAL/PLATELET
BASOS ABS: 0 {cells}/uL (ref 0–200)
BASOS PCT: 0 %
EOS ABS: 171 {cells}/uL (ref 15–500)
Eosinophils Relative: 3 %
HCT: 43.8 % (ref 38.5–50.0)
Hemoglobin: 15.2 g/dL (ref 13.0–17.0)
LYMPHS PCT: 34 %
Lymphs Abs: 1938 cells/uL (ref 850–3900)
MCH: 31 pg (ref 27.0–33.0)
MCHC: 34.7 g/dL (ref 32.0–36.0)
MCV: 89.4 fL (ref 80.0–100.0)
MONO ABS: 513 {cells}/uL (ref 200–950)
MONOS PCT: 9 %
MPV: 9.4 fL (ref 7.5–12.5)
NEUTROS PCT: 54 %
Neutro Abs: 3078 cells/uL (ref 1500–7800)
PLATELETS: 204 10*3/uL (ref 140–400)
RBC: 4.9 MIL/uL (ref 4.20–5.80)
RDW: 13.4 % (ref 11.0–15.0)
WBC: 5.7 10*3/uL (ref 3.8–10.8)

## 2016-08-17 LAB — LIPID PANEL
CHOL/HDL RATIO: 3.5 ratio (ref <=5.0)
CHOLESTEROL: 158 mg/dL (ref 125–200)
HDL: 45 mg/dL (ref >=40)
LDL Cholesterol: 98 mg/dL (ref <130)
TRIGLYCERIDES: 77 mg/dL (ref <150)
VLDL: 15 mg/dL (ref <30)

## 2016-08-17 LAB — PSA: PSA: 1.2 ng/mL (ref <=4.0)

## 2016-08-17 LAB — TSH: TSH: 1.63 m[IU]/L (ref 0.40–4.50)

## 2016-08-21 ENCOUNTER — Encounter: Payer: Self-pay | Admitting: Family Medicine

## 2016-08-21 ENCOUNTER — Ambulatory Visit (INDEPENDENT_AMBULATORY_CARE_PROVIDER_SITE_OTHER): Payer: BLUE CROSS/BLUE SHIELD | Admitting: Family Medicine

## 2016-08-21 VITALS — BP 118/76 | HR 64 | Temp 98.5°F | Resp 14 | Ht 72.0 in | Wt 189.0 lb

## 2016-08-21 DIAGNOSIS — Z23 Encounter for immunization: Secondary | ICD-10-CM | POA: Diagnosis not present

## 2016-08-21 DIAGNOSIS — Z0000 Encounter for general adult medical examination without abnormal findings: Secondary | ICD-10-CM | POA: Diagnosis not present

## 2016-08-21 NOTE — Addendum Note (Signed)
Addended by: Shary Decamp B on: 08/21/2016 03:43 PM   Modules accepted: Orders

## 2016-08-21 NOTE — Progress Notes (Signed)
Subjective:    Patient ID: Carlos [name], male    DOB: 01-Feb-1961, 55 y.o.   MRN: [id number]  HPI Patient is here for a complete physical exam. He has no concerns. He does have some mild pain in his left shoulder. Empty can sign is negative. Hawkins sign is negative. Spurling sign is negative. Reflexes are normal. I suspect that he has some mild arthritis. At the present time it is not bothersome enough that he would like to proceed with imaging of the shoulder. He would like a flu shot today. Last colonoscopy was normal in 2014. He is not due again until 2024. Prostate exam is due today. Most recent lab work as listed below Lab on 08/17/2016  Component Date Value Ref Range Status  . Sodium 08/17/2016 141  135 - 146 mmol/L Final  . Potassium 08/17/2016 4.2  3.5 - 5.3 mmol/L Final  . Chloride 08/17/2016 105  98 - 110 mmol/L Final  . CO2 08/17/2016 29  20 - 31 mmol/L Final  . Glucose, Bld 08/17/2016 103* 70 - 99 mg/dL Final  . BUN 08/17/2016 16  7 - 25 mg/dL Final  . Creat 08/17/2016 0.75  0.70 - 1.33 mg/dL Final   Comment:   For patients > or = 54 years of age: The upper reference limit for Creatinine is approximately 13% higher for people identified as African-American.     . Total Bilirubin 08/17/2016 0.4  0.2 - 1.2 mg/dL Final  . Alkaline Phosphatase 08/17/2016 44  40 - 115 U/L Final  . AST 08/17/2016 21  10 - 35 U/L Final  . ALT 08/17/2016 17  9 - 46 U/L Final  . Total Protein 08/17/2016 6.5  6.1 - 8.1 g/dL Final  . Albumin 08/17/2016 4.2  3.6 - 5.1 g/dL Final  . Calcium 08/17/2016 8.6  8.6 - 10.3 mg/dL Final  . GFR, Est African American 08/17/2016 >89  >=60 mL/min Final  . GFR, Est Non African American 08/17/2016 >89  >=60 mL/min Final  . TSH 08/17/2016 1.63  0.40 - 4.50 mIU/L Final  . Cholesterol 08/17/2016 158  125 - 200 mg/dL Final  . Triglycerides 08/17/2016 77  <150 mg/dL Final  . HDL 08/17/2016 45  >=40 mg/dL Final  . Total CHOL/HDL Ratio 08/17/2016 3.5  <=5.0 Ratio  Final  . VLDL 08/17/2016 15  <30 mg/dL Final  . LDL Cholesterol 08/17/2016 98  <130 mg/dL Final   Comment:   Total Cholesterol/HDL Ratio:CHD Risk                        Coronary Heart Disease Risk Table                                        Men       Women          1/2 Average Risk              3.4        3.3              Average Risk              5.0        4.4           2X Average Risk              9.6  7.1           3X Average Risk             23.4       11.0 Use the calculated Patient Ratio above and the CHD Risk table  to determine the patient's CHD Risk.   . WBC 08/17/2016 5.7  3.8 - 10.8 K/uL Final  . RBC 08/17/2016 4.90  4.20 - 5.80 MIL/uL Final  . Hemoglobin 08/17/2016 15.2  13.0 - 17.0 g/dL Final  . HCT 08/17/2016 43.8  38.5 - 50.0 % Final  . MCV 08/17/2016 89.4  80.0 - 100.0 fL Final  . MCH 08/17/2016 31.0  27.0 - 33.0 pg Final  . MCHC 08/17/2016 34.7  32.0 - 36.0 g/dL Final  . RDW 08/17/2016 13.4  11.0 - 15.0 % Final  . Platelets 08/17/2016 204  140 - 400 K/uL Final  . MPV 08/17/2016 9.4  7.5 - 12.5 fL Final  . Neutro Abs 08/17/2016 3078  1,500 - 7,800 cells/uL Final  . Lymphs Abs 08/17/2016 1938  850 - 3,900 cells/uL Final  . Monocytes Absolute 08/17/2016 513  200 - 950 cells/uL Final  . Eosinophils Absolute 08/17/2016 171  15 - 500 cells/uL Final  . Basophils Absolute 08/17/2016 0  0 - 200 cells/uL Final  . Neutrophils Relative % 08/17/2016 54  % Final  . Lymphocytes Relative 08/17/2016 34  % Final  . Monocytes Relative 08/17/2016 9  % Final  . Eosinophils Relative 08/17/2016 3  % Final  . Basophils Relative 08/17/2016 0  % Final  . Smear Review 08/17/2016 Criteria for review not met   Final  . PSA 08/17/2016 1.2  <=4.0 ng/mL Final   Comment:   The total PSA value from this assay system is standardized against the WHO standard. The test result will be approximately 20% lower when compared to the equimolar-standardized total PSA (Beckman  Coulter). Comparison of serial PSA results should be interpreted with this fact in mind.   This test was performed using the Siemens chemiluminescent method. Values obtained from different assay methods cannot be used interchangeably. PSA levels, regardless of value, should not be interpreted as absolute evidence of the presence or absence of disease.   Effective June 25, 2016, Total PSA is being tested on the Siemens Centaur XP using chemiluminescence methodology. Re-baseline testing will be available until September 24, 2016 at no charge. If you have a patient that may require re-baselining, please order [id number] in addition to [id number].    Past Medical History:  Diagnosis Date  . 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 TONSILLECTOMY     No current outpatient prescriptions on file prior to visit.   No current facility-administered medications on file prior to visit.    Allergies  Allergen Reactions  . Bee Venom    Social History   Social History  . Marital status: Married    Spouse name: N/A  . Number of children: N/A  . Years of education: N/A   Occupational History  . Not on file.   Social History Main Topics  . Smoking status: Never Smoker  . Smokeless tobacco: Never Used  . Alcohol use Yes     Comment: Occasional  . Drug use: No  . Sexual activity: Yes   Other Topics Concern  . Not on file   Social History Narrative  . No narrative on file   Family History  Problem  Relation Age of Onset  . Diabetes Mother   . Vision loss Father   . Cancer Father 6    prostate  . Cancer Maternal Grandfather   . Colon cancer Neg Hx       Review of Systems  All other systems reviewed and are negative.      Objective:   Physical Exam  Constitutional: He is oriented to person, place, and time. He appears well-developed and well-nourished. No distress.  HENT:  Head: Normocephalic and atraumatic.  Right Ear: External  ear normal.  Left Ear: External ear normal.  Nose: Nose normal.  Mouth/Throat: Oropharynx is clear and moist. No oropharyngeal exudate.  Eyes: Conjunctivae and EOM are normal. Pupils are equal, round, and reactive to light. Right eye exhibits no discharge. Left eye exhibits no discharge. No scleral icterus.  Neck: Normal range of motion. Neck supple. No JVD present. No tracheal deviation present. No thyromegaly present.  Cardiovascular: Normal rate, regular rhythm, normal heart sounds and intact distal pulses.  Exam reveals no gallop and no friction rub.   No murmur heard. Pulmonary/Chest: Effort normal and breath sounds normal. No stridor. No respiratory distress. He has no wheezes. He has no rales. He exhibits no tenderness.  Abdominal: Soft. Bowel sounds are normal. He exhibits no distension and no mass. There is no tenderness. There is no rebound and no guarding.  Genitourinary: Rectum normal, prostate normal and penis normal.  Musculoskeletal: Normal range of motion. He exhibits no edema or tenderness.  Lymphadenopathy:    He has no cervical adenopathy.  Neurological: He is alert and oriented to person, place, and time. He has normal reflexes. No cranial nerve deficit. He exhibits normal muscle tone. Coordination normal.  Skin: Skin is warm. No rash noted. He is not diaphoretic. No erythema. No pallor.  Psychiatric: He has a normal mood and affect. His behavior is normal. Judgment and thought content normal.  Vitals reviewed.  prostate exam is completely normal. There is no nodularity or asymmetry        Assessment & Plan:  1. Routine general medical examination at a health care facility Physical exam today is completely normal. Patient will receive his flu shot. Colonoscopy is up-to-date. Prostate exam is normal as is his PSA.  His lab work is excellent aside from a slight elevation in his sugar. Regular anticipatory guidance is provided

## 2016-10-29 ENCOUNTER — Ambulatory Visit: Payer: BLUE CROSS/BLUE SHIELD | Admitting: Family Medicine

## 2016-11-01 DIAGNOSIS — M2241 Chondromalacia patellae, right knee: Secondary | ICD-10-CM | POA: Diagnosis not present

## 2017-09-02 ENCOUNTER — Encounter: Payer: BLUE CROSS/BLUE SHIELD | Admitting: Family Medicine

## 2017-09-11 ENCOUNTER — Other Ambulatory Visit: Payer: BLUE CROSS/BLUE SHIELD

## 2017-09-11 ENCOUNTER — Other Ambulatory Visit: Payer: Self-pay | Admitting: Family Medicine

## 2017-09-11 DIAGNOSIS — Z125 Encounter for screening for malignant neoplasm of prostate: Secondary | ICD-10-CM | POA: Diagnosis not present

## 2017-09-11 DIAGNOSIS — Z0000 Encounter for general adult medical examination without abnormal findings: Secondary | ICD-10-CM | POA: Diagnosis not present

## 2017-09-12 LAB — CBC WITH DIFFERENTIAL/PLATELET
BASOS ABS: 39 {cells}/uL (ref 0–200)
Basophils Relative: 0.7 %
EOS ABS: 171 {cells}/uL (ref 15–500)
Eosinophils Relative: 3.1 %
HCT: 44 % (ref 38.5–50.0)
HEMOGLOBIN: 15.2 g/dL (ref 13.2–17.1)
Lymphs Abs: 1843 cells/uL (ref 850–3900)
MCH: 30.3 pg (ref 27.0–33.0)
MCHC: 34.5 g/dL (ref 32.0–36.0)
MCV: 87.8 fL (ref 80.0–100.0)
MONOS PCT: 10.1 %
MPV: 9.4 fL (ref 7.5–12.5)
NEUTROS ABS: 2893 {cells}/uL (ref 1500–7800)
Neutrophils Relative %: 52.6 %
Platelets: 245 10*3/uL (ref 140–400)
RBC: 5.01 10*6/uL (ref 4.20–5.80)
RDW: 12.7 % (ref 11.0–15.0)
Total Lymphocyte: 33.5 %
WBC: 5.5 10*3/uL (ref 3.8–10.8)
WBCMIX: 556 {cells}/uL (ref 200–950)

## 2017-09-12 LAB — COMPLETE METABOLIC PANEL WITH GFR
AG Ratio: 1.8 (calc) (ref 1.0–2.5)
ALKALINE PHOSPHATASE (APISO): 45 U/L (ref 40–115)
ALT: 18 U/L (ref 9–46)
AST: 17 U/L (ref 10–35)
Albumin: 4.1 g/dL (ref 3.6–5.1)
BUN: 15 mg/dL (ref 7–25)
CALCIUM: 8.9 mg/dL (ref 8.6–10.3)
CO2: 30 mmol/L (ref 20–32)
CREATININE: 0.83 mg/dL (ref 0.70–1.33)
Chloride: 102 mmol/L (ref 98–110)
GFR, EST NON AFRICAN AMERICAN: 99 mL/min/{1.73_m2} (ref >=60)
GFR, Est African American: 115 mL/min/{1.73_m2} (ref >=60)
GLOBULIN: 2.3 g/dL (ref 1.9–3.7)
GLUCOSE: 101 mg/dL — AB (ref 65–99)
Potassium: 4.3 mmol/L (ref 3.5–5.3)
SODIUM: 139 mmol/L (ref 135–146)
Total Bilirubin: 0.4 mg/dL (ref 0.2–1.2)
Total Protein: 6.4 g/dL (ref 6.1–8.1)

## 2017-09-12 LAB — LIPID PANEL
CHOL/HDL RATIO: 3.7 (calc) (ref <5.0)
CHOLESTEROL: 187 mg/dL (ref <200)
HDL: 50 mg/dL (ref >40)
LDL CHOLESTEROL (CALC): 111 mg/dL — AB
NON-HDL CHOLESTEROL (CALC): 137 mg/dL — AB (ref <130)
Triglycerides: 145 mg/dL (ref <150)

## 2017-09-12 LAB — PSA: PSA: 1.6 ng/mL (ref <=4.0)

## 2017-09-16 ENCOUNTER — Encounter: Payer: Self-pay | Admitting: Family Medicine

## 2017-09-16 ENCOUNTER — Ambulatory Visit (INDEPENDENT_AMBULATORY_CARE_PROVIDER_SITE_OTHER): Payer: BLUE CROSS/BLUE SHIELD | Admitting: Family Medicine

## 2017-09-16 VITALS — BP 126/82 | HR 66 | Temp 98.0°F | Resp 14 | Ht 72.0 in | Wt 190.0 lb

## 2017-09-16 DIAGNOSIS — Z0000 Encounter for general adult medical examination without abnormal findings: Secondary | ICD-10-CM | POA: Diagnosis not present

## 2017-09-16 DIAGNOSIS — Z23 Encounter for immunization: Secondary | ICD-10-CM

## 2017-09-16 MED ORDER — MELOXICAM 15 MG PO TABS: 15.0000 mg | ORAL_TABLET | Freq: Every day | ORAL | 3 refills | Status: DC

## 2017-09-16 NOTE — Progress Notes (Signed)
Subjective:    Patient ID: Carlos [name], male    DOB: [birth year], 56 y.o.   MRN: [id number]  HPI Patient is here for a complete physical exam.he reports pain over the lateral epicondyle in his left elbow.  Pain is made worse with gripping action of the hand and with dorsiflexion of the left wrist.  Pain is consistent with lateral epicondylitis.  He denies any injury to the elbow.  There is no effusion or crepitus in the elbow.  He also has pain in the right heel near the insertion of the plantar fascia.  Pain is worse with his first few steps in the morning.  It then improves after stretching his foot.  It feels like a sharp stabbing pain in the heel.  He would like a flu shot today. Last colonoscopy was normal in 2014. He is not due again until 2024. Prostate exam is due today. Most recent lab work as listed below Appointment on 09/11/2017  Component Date Value Ref Range Status  . PSA 09/11/2017 1.6  < OR = 4.0 ng/mL Final   Comment: The total PSA value from this assay system is  standardized against the WHO standard. The test  result will be approximately 20% lower when compared  to the equimolar-standardized total PSA (Beckman  Coulter). Comparison of serial PSA results should be  interpreted with this fact in mind. . This test was performed using the Siemens  chemiluminescent method. Values obtained from  different assay methods cannot be used interchangeably. PSA levels, regardless of value, should not be interpreted as absolute evidence of the presence or absence of disease.   . WBC 09/11/2017 5.5  3.8 - 10.8 Thousand/uL Final  . RBC 09/11/2017 5.01  4.20 - 5.80 Million/uL Final  . Hemoglobin 09/11/2017 15.2  13.2 - 17.1 g/dL Final  . HCT 09/11/2017 44.0  38.5 - 50.0 % Final  . MCV 09/11/2017 87.8  80.0 - 100.0 fL Final  . MCH 09/11/2017 30.3  27.0 - 33.0 pg Final  . MCHC 09/11/2017 34.5  32.0 - 36.0 g/dL Final  . RDW 09/11/2017 12.7  11.0 - 15.0 % Final  . Platelets  09/11/2017 245  140 - 400 Thousand/uL Final  . MPV 09/11/2017 9.4  7.5 - 12.5 fL Final  . Neutro Abs 09/11/2017 2893  1,500 - 7,800 cells/uL Final  . Lymphs Abs 09/11/2017 1843  850 - 3,900 cells/uL Final  . WBC mixed population 09/11/2017 556  200 - 950 cells/uL Final  . Eosinophils Absolute 09/11/2017 171  15 - 500 cells/uL Final  . Basophils Absolute 09/11/2017 39  0 - 200 cells/uL Final  . Neutrophils Relative % 09/11/2017 52.6  % Final  . Total Lymphocyte 09/11/2017 33.5  % Final  . Monocytes Relative 09/11/2017 10.1  % Final  . Eosinophils Relative 09/11/2017 3.1  % Final  . Basophils Relative 09/11/2017 0.7  % Final  . Cholesterol 09/11/2017 187  <200 mg/dL Final  . HDL 09/11/2017 50  >40 mg/dL Final  . Triglycerides 09/11/2017 145  <150 mg/dL Final  . LDL Cholesterol (Calc) 09/11/2017 111* mg/dL (calc) Final   Comment: Reference range: <100 . Desirable range <100 mg/dL for primary prevention;   <70 mg/dL for patients with CHD or diabetic patients  with > or = 2 CHD risk factors. Marland Kitchen LDL-C is now calculated using the Martin-Hopkins  calculation, which is a validated novel method providing  better accuracy than the Friedewald equation in the  estimation of LDL-C.  Cresenciano Genre et al. Annamaria Helling. 6073;710(62): 2061-2068  (http://education.[facility].com/faq/FAQ164)   . Total CHOL/HDL Ratio 09/11/2017 3.7  <5.0 (calc) Final  . Non-HDL Cholesterol (Calc) 09/11/2017 137* <130 mg/dL (calc) Final   Comment: For patients with diabetes plus 1 major ASCVD risk  factor, treating to a non-HDL-C goal of <100 mg/dL  (LDL-C of <70 mg/dL) is considered a therapeutic  option.   . Glucose, Bld 09/11/2017 101* 65 - 99 mg/dL Final   Comment: .            Fasting reference interval . For someone without known diabetes, a glucose value between 100 and 125 mg/dL is consistent with prediabetes and should be confirmed with a follow-up test. .   . BUN 09/11/2017 15  7 - 25 mg/dL Final  . Creat  09/11/2017 0.83  0.70 - 1.33 mg/dL Final   Comment: For patients >23 years of age, the reference limit for Creatinine is approximately 13% higher for people identified as African-American. .   . GFR, Est Non African American 09/11/2017 99  > OR = 60 mL/min/1.16m2 Final  . GFR, Est African American 09/11/2017 115  > OR = 60 mL/min/1.5m2 Final  . BUN/Creatinine Ratio 69/48/5462 NOT APPLICABLE  6 - 22 (calc) Final  . Sodium 09/11/2017 139  135 - 146 mmol/L Final  . Potassium 09/11/2017 4.3  3.5 - 5.3 mmol/L Final  . Chloride 09/11/2017 102  98 - 110 mmol/L Final  . CO2 09/11/2017 30  20 - 32 mmol/L Final  . Calcium 09/11/2017 8.9  8.6 - 10.3 mg/dL Final  . Total Protein 09/11/2017 6.4  6.1 - 8.1 g/dL Final  . Albumin 09/11/2017 4.1  3.6 - 5.1 g/dL Final  . Globulin 09/11/2017 2.3  1.9 - 3.7 g/dL (calc) Final  . AG Ratio 09/11/2017 1.8  1.0 - 2.5 (calc) Final  . Total Bilirubin 09/11/2017 0.4  0.2 - 1.2 mg/dL Final  . Alkaline phosphatase (APISO) 09/11/2017 45  40 - 115 U/L Final  . AST 09/11/2017 17  10 - 35 U/L Final  . ALT 09/11/2017 18  9 - 46 U/L Final   Past Medical History:  Diagnosis Date  . 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 TONSILLECTOMY     No current outpatient prescriptions on file prior to visit.   No current facility-administered medications on file prior to visit.    Allergies  Allergen Reactions  . Bee Venom    Social History   Social History  . Marital status: Married    Spouse name: N/A  . Number of children: N/A  . Years of education: N/A   Occupational History  . Not on file.   Social History Main Topics  . Smoking status: Never Smoker  . Smokeless tobacco: Never Used  . Alcohol use Yes     Comment: Occasional  . Drug use: No  . Sexual activity: Yes   Other Topics Concern  . Not on file   Social History Narrative  . No narrative on file   Family History  Problem Relation Age of Onset  .  Diabetes Mother   . Vision loss Father   . Cancer Father 62       prostate  . Cancer Maternal Grandfather   . Colon cancer Neg Hx       Review of Systems  All other systems reviewed and are negative.      Objective:  Physical Exam  Constitutional: He is oriented to person, place, and time. He appears well-developed and well-nourished. No distress.  HENT:  Head: Normocephalic and atraumatic.  Right Ear: External ear normal.  Left Ear: External ear normal.  Nose: Nose normal.  Mouth/Throat: Oropharynx is clear and moist. No oropharyngeal exudate.  Eyes: Pupils are equal, round, and reactive to light. Conjunctivae and EOM are normal. Right eye exhibits no discharge. Left eye exhibits no discharge. No scleral icterus.  Neck: Normal range of motion. Neck supple. No JVD present. No tracheal deviation present. No thyromegaly present.  Cardiovascular: Normal rate, regular rhythm, normal heart sounds and intact distal pulses.  Exam reveals no gallop and no friction rub.   No murmur heard. Pulmonary/Chest: Effort normal and breath sounds normal. No stridor. No respiratory distress. He has no wheezes. He has no rales. He exhibits no tenderness.  Abdominal: Soft. Bowel sounds are normal. He exhibits no distension and no mass. There is no tenderness. There is no rebound and no guarding.  Genitourinary: Rectum normal, prostate normal and penis normal.  Musculoskeletal: Normal range of motion. He exhibits no edema or tenderness.  Lymphadenopathy:    He has no cervical adenopathy.  Neurological: He is alert and oriented to person, place, and time. He has normal reflexes. No cranial nerve deficit. He exhibits normal muscle tone. Coordination normal.  Skin: Skin is warm. No rash noted. He is not diaphoretic. No erythema. No pallor.  Psychiatric: He has a normal mood and affect. His behavior is normal. Judgment and thought content normal.  Vitals reviewed.  prostate exam is completely normal.  There is no nodularity or asymmetry        Assessment & Plan:  1. Routine general medical examination at a health care facility Physical exam today is completely normal. Patient will receive his flu shot. Colonoscopy is up-to-date. Prostate exam is normal as is his PSA.  His lab work is excellent aside from a slight elevation in his sugar. Regular anticipatory guidance is provided  I believe he has lateral epicondylitis.  I recommended an elbow strap as well as a wrist splint to prevent dorsiflexion and rest.  He can also use meloxicam 15 mg a day.  If not improving, the next that would be a cortisone injection.  Also believe he has plantar fasciitis in the right heel.  I recommended stretches that he can perform as well as meloxicam 15 mg a day.  If not improving, he can return for a cortisone injection there as well

## 2017-11-08 ENCOUNTER — Other Ambulatory Visit: Payer: Self-pay | Admitting: Family Medicine

## 2017-11-08 MED ORDER — MELOXICAM 15 MG PO TABS: 15.0000 mg | ORAL_TABLET | Freq: Every day | ORAL | 3 refills | Status: DC

## 2017-12-04 DIAGNOSIS — M25562 Pain in left knee: Secondary | ICD-10-CM | POA: Diagnosis not present

## 2017-12-04 DIAGNOSIS — M2241 Chondromalacia patellae, right knee: Secondary | ICD-10-CM | POA: Diagnosis not present

## 2018-03-24 ENCOUNTER — Ambulatory Visit (INDEPENDENT_AMBULATORY_CARE_PROVIDER_SITE_OTHER): Payer: BLUE CROSS/BLUE SHIELD | Admitting: Family Medicine

## 2018-03-24 ENCOUNTER — Encounter: Payer: Self-pay | Admitting: Family Medicine

## 2018-03-24 VITALS — BP 118/80 | HR 60 | Temp 98.0°F | Resp 12 | Ht 72.0 in | Wt 179.0 lb

## 2018-03-24 DIAGNOSIS — B079 Viral wart, unspecified: Secondary | ICD-10-CM | POA: Diagnosis not present

## 2018-03-24 NOTE — Progress Notes (Signed)
   Subjective:    Patient ID: Carlos [name], male    DOB: 05/17/1961, 57 y.o.   MRN: [id number]  HPI  Patient presents with a new growth on his left rib.  It is 4 to 5 mm in diameter.  It is a well-circumscribed wartlike papule with scale.  He states is been growing over the last month.  He would like it treated or removed.  We discussed options including cryotherapy versus excision.  Patient would like to try cryotherapy.  Past Medical History:  Diagnosis Date  . 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 TONSILLECTOMY     No current outpatient medications on file prior to visit.   No current facility-administered medications on file prior to visit.    Allergies  Allergen Reactions  . Bee Venom    Social History   Socioeconomic History  . Marital status: Married    Spouse name: Not on file  . Number of children: Not on file  . Years of education: Not on file  . Highest education level: Not on file  Occupational History  . Not on file  Social Needs  . Financial resource strain: Not on file  . Food insecurity:    Worry: Not on file    Inability: Not on file  . Transportation needs:    Medical: Not on file    Non-medical: Not on file  Tobacco Use  . Smoking status: Never Smoker  . Smokeless tobacco: Never Used  Substance and Sexual Activity  . Alcohol use: Yes    Comment: Occasional  . Drug use: No  . Sexual activity: Yes  Lifestyle  . Physical activity:    Days per week: Not on file    Minutes per session: Not on file  . Stress: Not on file  Relationships  . Social connections:    Talks on phone: Not on file    Gets together: Not on file    Attends religious service: Not on file    Active member of club or organization: Not on file    Attends meetings of clubs or organizations: Not on file    Relationship status: Not on file  . Intimate partner violence:    Fear of current or ex partner: Not on file   Emotionally abused: Not on file    Physically abused: Not on file    Forced sexual activity: Not on file  Other Topics Concern  . Not on file  Social History Narrative  . Not on file     Review of Systems  All other systems reviewed and are negative.      Objective:   Physical Exam  Constitutional: He appears well-developed and well-nourished.  Cardiovascular: Normal rate and regular rhythm.  Pulmonary/Chest: Effort normal and breath sounds normal.  Abdominal:    Vitals reviewed.         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  I have very little suspicion about skin cancer.  I recommended cryotherapy.  Lesion was treated with liquid nitrogen cryotherapy for a total of 30 seconds.  Patient tolerated the procedure well without complication.  Wound care was discussed.  If lesion persists, we could perform an excisional biopsy.

## 2018-08-01 ENCOUNTER — Ambulatory Visit (INDEPENDENT_AMBULATORY_CARE_PROVIDER_SITE_OTHER): Payer: BLUE CROSS/BLUE SHIELD | Admitting: Family Medicine

## 2018-08-01 ENCOUNTER — Encounter: Payer: Self-pay | Admitting: Family Medicine

## 2018-08-01 ENCOUNTER — Ambulatory Visit (HOSPITAL_COMMUNITY)
Admission: RE | Admit: 2018-08-01 | Discharge: 2018-08-01 | Disposition: A | Discharge status: Home / Self Care | Payer: BLUE CROSS/BLUE SHIELD | Source: Ambulatory Visit | Attending: Family Medicine | Admitting: Family Medicine

## 2018-08-01 ENCOUNTER — Other Ambulatory Visit: Payer: Self-pay

## 2018-08-01 VITALS — BP 122/64 | HR 70 | Temp 98.6°F | Resp 14 | Ht 72.0 in | Wt 165.0 lb

## 2018-08-01 DIAGNOSIS — M47812 Spondylosis without myelopathy or radiculopathy, cervical region: Secondary | ICD-10-CM | POA: Insufficient documentation

## 2018-08-01 DIAGNOSIS — M542 Cervicalgia: Secondary | ICD-10-CM

## 2018-08-01 DIAGNOSIS — G44209 Tension-type headache, unspecified, not intractable: Secondary | ICD-10-CM | POA: Diagnosis not present

## 2018-08-01 DIAGNOSIS — J019 Acute sinusitis, unspecified: Secondary | ICD-10-CM

## 2018-08-01 MED ORDER — PREDNISONE 20 MG PO TABS: 40.0000 mg | ORAL_TABLET | Freq: Every day | ORAL | 0 refills | Status: AC

## 2018-08-01 MED ORDER — CYCLOBENZAPRINE HCL 10 MG PO TABS: 10.0000 mg | ORAL_TABLET | Freq: Three times a day (TID) | ORAL | 0 refills | Status: DC | PRN

## 2018-08-01 MED ORDER — FLUTICASONE PROPIONATE 50 MCG/ACT NA SUSP: 2.0000 | Freq: Every day | NASAL | 6 refills | Status: DC

## 2018-08-01 MED ORDER — DOXYCYCLINE HYCLATE 100 MG PO TABS: 100.0000 mg | ORAL_TABLET | Freq: Two times a day (BID) | ORAL | 0 refills | Status: AC

## 2018-08-01 MED ORDER — IPRATROPIUM BROMIDE 0.03 % NA SOLN: 2.0000 | Freq: Two times a day (BID) | NASAL | 12 refills | Status: DC

## 2018-08-01 NOTE — Progress Notes (Signed)
Patient ID: Carlos [name], male    DOB: 1961-07-05, 57 y.o.   MRN: [id number]  PCP: Susy Frizzle, MD  Chief Complaint  Patient presents with  . Neck Pain    x2 weeks- pain in B sides of neck and HA- decreased ROM  . Illness    x10 days- head congestion, fever/ chills    Subjective:   Carlos [name] is a 57 y.o. male, presents to clinic with CC of neck pain x 2 weeks and URI sx x 1 week.  Pain started with "twisted neck" he felt a sudden onset of pain described as "pinched" located on left posterior side of neck.  He then felt his neck get stiff, tight to move neck, now both sides feels stiff.  Is worse when driving and having look over shoulder it is worse.  Pain is intermittent, severity rated 8/10 constantly reoccurs when he moves head and neck.   He denies any radiation of pain, denies any weakness, numbness, tingling in his bilateral upper extremities.  Looking up and touching his chin to his chest.  Some relief by using heating pad, Vicks VapoRub, with gentle stretching.  He thought that the neck pain would start to improve by now but it has remained fairly constant.  If he is holding still he has 0 pain but with movement is fairly severe.  Is any prior neck injury, strain or surgeries.  He has some mild stiffness in his bilateral wrists and hands which she states may be arthritis but he has no known history of arthritis in his back or other joints.   As his neck pain and stiffness has gradually worsened it has caused associated occipital HA, fairly constant, located only in the back of his head without any radiation, also feels tight with some throbbing and occasionally more sharp with movement.  He denies any eye pain, photophobia, nausea, vomiting, near-syncope, slurred speech, numbness or tingling of his face.  He had some tick bites a few months ago but never felt ill in the following months, he currently denies any rash anywhere. About a week ago, roughly 1 week after his  neck pain began he started to have both of his ears begin to bother him felt like he had a cold or flu.  States that his ears feel somewhat blocked with a pressure sensation.  He has no swelling in or around his ears he denies any redness or drainage.  With this separate illness that began 1 week ago he reports associated subjective fevers with congestion.  And he denies sweats, hot or cold chills, nausea, vomiting, sore throat, cough, chest congestion, chest pain, shortness of breath, back pain, abdominal pain, diarrhea. No known sick contacts, patient denies immunocompromise state.  Patient Active Problem List   Diagnosis Date Noted  . GERD (gastroesophageal reflux disease) 08/23/2014  . Atypical chest pain 08/23/2014  . Laceration of arm 12/28/2013  . Paresthesia of hand 12/28/2013  . HSV-1 infection      Prior to Admission medications   Not on File     Allergies  Allergen Reactions  . Bee Venom    Patient denies any family history of aneurysms  Family History  Problem Relation Age of Onset  . Diabetes Mother   . Vision loss Father   . Cancer Father 39       prostate  . Cancer Maternal Grandfather   . Colon cancer Neg Hx      Social History  Socioeconomic History  . Marital status: Married    Spouse name: Not on file  . Number of children: Not on file  . Years of education: Not on file  . Highest education level: Not on file  Occupational History  . Not on file  Social Needs  . Financial resource strain: Not on file  . Food insecurity:    Worry: Not on file    Inability: Not on file  . Transportation needs:    Medical: Not on file    Non-medical: Not on file  Tobacco Use  . Smoking status: Never Smoker  . Smokeless tobacco: Never Used  Substance and Sexual Activity  . Alcohol use: Yes    Comment: Occasional  . Drug use: No  . Sexual activity: Yes  Lifestyle  . Physical activity:    Days per week: Not on file    Minutes per session: Not on file  .  Stress: Not on file  Relationships  . Social connections:    Talks on phone: Not on file    Gets together: Not on file    Attends religious service: Not on file    Active member of club or organization: Not on file    Attends meetings of clubs or organizations: Not on file    Relationship status: Not on file  . Intimate partner violence:    Fear of current or ex partner: Not on file    Emotionally abused: Not on file    Physically abused: Not on file    Forced sexual activity: Not on file  Other Topics Concern  . Not on file  Social History Narrative  . Not on file     Review of Systems  Constitutional: Negative.   HENT: Negative.   Eyes: Negative.   Respiratory: Negative.   Cardiovascular: Negative.   Gastrointestinal: Negative.   Endocrine: Negative.   Genitourinary: Negative.   Musculoskeletal: Negative for back pain, gait problem, joint swelling, myalgias and neck stiffness.  Skin: Negative.  Negative for color change and rash.  Allergic/Immunologic: Negative.   Neurological: Negative.  Negative for dizziness, tremors, seizures, syncope, facial asymmetry, speech difficulty, weakness, light-headedness and numbness.  Hematological: Negative.  Does not bruise/bleed easily.  Psychiatric/Behavioral: Negative.   All other systems reviewed and are negative.      Objective:    Vitals:   08/01/18 0936  BP: 122/64  Pulse: 70  Resp: 14  Temp: 98.6 F (37 C)  TempSrc: Oral  SpO2: 98%  Weight: 165 lb (74.8 kg)  Height: 6' (1.829 m)      Physical Exam  Constitutional: He is oriented to person, place, and time. He appears well-developed and well-nourished.  Non-toxic appearance. He does not appear ill. No distress.  Well-appearing middle-aged man, appears stated age, no acute distress, nontoxic-appearing  HENT:  Head: Normocephalic and atraumatic. Head is without raccoon's eyes, without Battle's sign, without abrasion, without contusion, without right periorbital  erythema and without left periorbital erythema. Hair is normal.  Right Ear: Tympanic membrane, external ear and ear canal normal. No drainage, swelling or tenderness. No foreign bodies. No mastoid tenderness. No middle ear effusion. No decreased hearing is noted.  Left Ear: Tympanic membrane, external ear and ear canal normal. No drainage, swelling or tenderness. No foreign bodies. No mastoid tenderness.  No middle ear effusion. No decreased hearing is noted.  Nose: Right sinus exhibits no maxillary sinus tenderness and no frontal sinus tenderness. Left sinus exhibits no maxillary sinus tenderness and  no frontal sinus tenderness.  Mouth/Throat: Uvula is midline, oropharynx is clear and moist and mucous membranes are normal. Mucous membranes are not pale, not dry and not cyanotic. No trismus in the jaw. No uvula swelling. No oropharyngeal exudate, posterior oropharyngeal edema, posterior oropharyngeal erythema or tonsillar abscesses. No tonsillar exudate.  Nasal mucosa very erythematous with moderate edema, no sinus tenderness to palpation Ear oropharynx normal appearing  Eyes: Pupils are equal, round, and reactive to light. Conjunctivae, EOM and lids are normal. Right eye exhibits no chemosis and no discharge. Left eye exhibits no chemosis and no discharge. Right conjunctiva is not injected. Left conjunctiva is not injected. No scleral icterus. Right eye exhibits normal extraocular motion and no nystagmus. Left eye exhibits normal extraocular motion and no nystagmus.  Neck: Trachea normal and phonation normal. Neck supple. No tracheal tenderness, no spinous process tenderness and no muscular tenderness present. Carotid bruit is not present. No neck rigidity. Decreased range of motion present. No tracheal deviation, no edema and no erythema present. No Brudzinski's sign and no Kernig's sign noted. No thyroid mass and no thyromegaly present.  Limited with rotation of neck to the left only to about 45 degrees,  grade rotation of neck to the right to about 60 degrees Normal flexion and extension at the neck, no tenderness to palpation of bilateral SCMs and trapezius, no midline tenderness from cervical to lumbar spine, no step-offs, no paraspinal muscle tenderness to palpation from cervical lumbar spine, no muscle tension palpated, no fasciculations  Cardiovascular: Regular rhythm, normal heart sounds and normal pulses. Exam reveals no gallop and no friction rub.  No murmur heard. Pulses:      Radial pulses are 2+ on the right side, and 2+ on the left side.       Posterior tibial pulses are 2+ on the right side, and 2+ on the left side.  Pulmonary/Chest: Effort normal and breath sounds normal. He has no decreased breath sounds. He has no wheezes. He has no rhonchi. He has no rales.  Abdominal: Soft. Normal appearance and bowel sounds are normal. He exhibits no distension. There is no tenderness. There is no rebound and no guarding.  Musculoskeletal: He exhibits no edema.  Neurological: He is alert and oriented to person, place, and time. He has normal strength. He displays no tremor. No cranial nerve deficit or sensory deficit. He exhibits normal muscle tone. Coordination and gait normal.  Skin: Skin is warm, dry and intact. Capillary refill takes less than 2 seconds. No rash noted. He is not diaphoretic.  Psychiatric: He has a normal mood and affect. His speech is normal and behavior is normal.  Nursing note and vitals reviewed.         Assessment & Plan:   56 year old male presents with neck pain and stiffness for 2 weeks and bilateral ear fullness and head congestion for 1 week.  Exam was comprehensive to make sure there was no connection between the neck pain, headaches and ear and congestion symptoms I do believe that the onset was at 2 different times.  Does not seem to be any concern for meningitis or tickborne illness is very well-appearing, afebrile.  A&P-   ICD-10-CM   1. Neck pain M54.2  cyclobenzaprine (FLEXERIL) 10 MG tablet    DG Cervical Spine Complete    predniSONE (DELTASONE) 20 MG tablet  2. Tension headache G44.209 cyclobenzaprine (FLEXERIL) 10 MG tablet  3. Acute non-recurrent sinusitis, unspecified location J01.90 doxycycline (VIBRA-TABS) 100 MG tablet  fluticasone (FLONASE) 50 MCG/ACT nasal spray    ipratropium (ATROVENT) 0.03 % nasal spray    predniSONE (DELTASONE) 20 MG tablet    Sinusitis - feel its still viral, one week, no ttp to sinuses, afebrile - nasal spray, antihistamine and decongestatants - going into weekend doxy to hold only take for acute worsening  Neck pain - tweaked neck?  Although no muscle ttp on exam, limited rotation to the left, normal rotation to right, no meningial signs, no radiculopathy.  Cervical spine xray to eval alignment/arthritis.  Flags for vertebral artery dissection or any other concerning causes of neck pain with secondary headache.  Tension HA, suspect secondary to both above - muscle relaxers, heat tx, gentle stretches tylenol/ibuprofen PRN and as directed on box.  Reviewed red flags to go to ER for.   F/up in office as needed if sx not gradually improving.     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  Pain may be more related to this arthritis than any strain and will treat with some steroids.  Plan for follow-up would be to return if no improvement in the next several weeks and may need referral to a spinal specialist if has pain continue.

## 2018-08-01 NOTE — Patient Instructions (Signed)
Neck pain - keep doing heating pad and gentle stretching, - I sent a muscle relaxer  For sinuses do steroid nasal spray and decongestant and antihistamines  Start doxycycline antibiotic if you have worsening sinus pain with fever - pain in face, around eyes or in teeth.  Go to the ER if you have any severe head aches with eye pain or nausea or vomiting with neck stiffness where you cannot put your chin to your chest and doing so radiates pain down spine.

## 2018-08-07 ENCOUNTER — Telehealth: Payer: Self-pay | Admitting: Family Medicine

## 2018-08-07 NOTE — Addendum Note (Signed)
Addended by: Delsa Grana on: 08/07/2018 01:30 PM   Modules accepted: Orders

## 2018-08-07 NOTE — Progress Notes (Signed)
See pt phone call encounter for today. He continues to experience pain and would like referral to spinal specialist.  Referral entered.

## 2018-08-07 NOTE — Telephone Encounter (Signed)
Patient called in stating that he is still experiencing neck stiffness even after taking the medications.  Patient would like a referral to a spine specialist.Please advise?   Wants return call at [phone] ext [phone]

## 2018-08-07 NOTE — Telephone Encounter (Signed)
Spoke with patient and informed him that we are going to place a referral. The patient verbalized understanding.

## 2018-08-07 NOTE — Telephone Encounter (Signed)
Referral entered with 08/01/18 encounter

## 2018-08-15 DIAGNOSIS — M542 Cervicalgia: Secondary | ICD-10-CM | POA: Diagnosis not present

## 2018-09-19 ENCOUNTER — Other Ambulatory Visit: Payer: BLUE CROSS/BLUE SHIELD

## 2018-09-19 DIAGNOSIS — Z0000 Encounter for general adult medical examination without abnormal findings: Secondary | ICD-10-CM | POA: Diagnosis not present

## 2018-09-19 DIAGNOSIS — Z125 Encounter for screening for malignant neoplasm of prostate: Secondary | ICD-10-CM

## 2018-09-22 ENCOUNTER — Ambulatory Visit (INDEPENDENT_AMBULATORY_CARE_PROVIDER_SITE_OTHER): Payer: BLUE CROSS/BLUE SHIELD | Admitting: Family Medicine

## 2018-09-22 ENCOUNTER — Encounter: Payer: Self-pay | Admitting: Family Medicine

## 2018-09-22 VITALS — BP 140/86 | HR 58 | Temp 98.0°F | Resp 12 | Ht 72.0 in | Wt 168.0 lb

## 2018-09-22 DIAGNOSIS — Z23 Encounter for immunization: Secondary | ICD-10-CM

## 2018-09-22 DIAGNOSIS — Z0000 Encounter for general adult medical examination without abnormal findings: Secondary | ICD-10-CM | POA: Diagnosis not present

## 2018-09-22 NOTE — Progress Notes (Signed)
Subjective:    Patient ID: Carlos [name], male    DOB: August 31, 1961, 57 y.o.   MRN: [id number]  HPI Patient is here for a complete physical exam.  He recently has been experiencing pain in his neck.  He had a cervical spine x-ray that showed mild degenerative disc disease and facet arthropathy.  He saw neurosurgery who recommended symptomatic therapy with NSAIDs.  He is not currently taking any medication due to his fear of side effects from medication.  Due today for a flu shot.  He declines HIV and hepatitis C screening.  He is due for prostate cancer screening.  His last colonoscopy was in 2014.  He is not due to repeat this until 2024.  His most recent lab work is listed below.  He does have elevated cholesterol however his ten-year risk of cardiovascular disease is calculated to be 5.9% based on this and therefore he does not rise to the level of requiring a statin Lab on 09/19/2018  Component Date Value Ref Range Status  . Cholesterol 09/19/2018 219* <200 mg/dL Final  . HDL 09/19/2018 70  >40 mg/dL Final  . Triglycerides 09/19/2018 53  <150 mg/dL Final  . LDL Cholesterol (Calc) 09/19/2018 135* mg/dL (calc) Final   Comment: Reference range: <100 . Desirable range <100 mg/dL for primary prevention;   <70 mg/dL for patients with CHD or diabetic patients  with > or = 2 CHD risk factors. Marland Kitchen LDL-C is now calculated using the Martin-Hopkins  calculation, which is a validated novel method providing  better accuracy than the Friedewald equation in the  estimation of LDL-C.  Cresenciano Genre et al. Annamaria Helling. 2637;858(85): 2061-2068  (http://education.[facility].com/faq/FAQ164)   . Total CHOL/HDL Ratio 09/19/2018 3.1  <5.0 (calc) Final  . Non-HDL Cholesterol (Calc) 09/19/2018 149* <130 mg/dL (calc) Final   Comment: For patients with diabetes plus 1 major ASCVD risk  factor, treating to a non-HDL-C goal of <100 mg/dL  (LDL-C of <70 mg/dL) is considered a therapeutic  option.   . WBC 09/19/2018  5.7  3.8 - 10.8 Thousand/uL Final  . RBC 09/19/2018 5.04  4.20 - 5.80 Million/uL Final  . Hemoglobin 09/19/2018 15.3  13.2 - 17.1 g/dL Final  . HCT 09/19/2018 44.5  38.5 - 50.0 % Final  . MCV 09/19/2018 88.3  80.0 - 100.0 fL Final  . MCH 09/19/2018 30.4  27.0 - 33.0 pg Final  . MCHC 09/19/2018 34.4  32.0 - 36.0 g/dL Final  . RDW 09/19/2018 13.0  11.0 - 15.0 % Final  . Platelets 09/19/2018 228  140 - 400 Thousand/uL Final  . MPV 09/19/2018 9.6  7.5 - 12.5 fL Final  . Neutro Abs 09/19/2018 2,679  1,500 - 7,800 cells/uL Final  . Lymphs Abs 09/19/2018 2,257  850 - 3,900 cells/uL Final  . WBC mixed population 09/19/2018 570  200 - 950 cells/uL Final  . Eosinophils Absolute 09/19/2018 171  15 - 500 cells/uL Final  . Basophils Absolute 09/19/2018 23  0 - 200 cells/uL Final  . Neutrophils Relative % 09/19/2018 47  % Final  . Total Lymphocyte 09/19/2018 39.6  % Final  . Monocytes Relative 09/19/2018 10.0  % Final  . Eosinophils Relative 09/19/2018 3.0  % Final  . Basophils Relative 09/19/2018 0.4  % Final  . Glucose, Bld 09/19/2018 99  65 - 99 mg/dL Final   Comment: .            Fasting reference interval .   . BUN 09/19/2018 21  7 - 25 mg/dL Final  . Creat 09/19/2018 0.92  0.70 - 1.33 mg/dL Final   Comment: For patients >61 years of age, the reference limit for Creatinine is approximately 13% higher for people identified as African-American. .   Havery Moros Ratio 09/38/1829 NOT APPLICABLE  6 - 22 (calc) Final  . Sodium 09/19/2018 141  135 - 146 mmol/L Final  . Potassium 09/19/2018 4.4  3.5 - 5.3 mmol/L Final  . Chloride 09/19/2018 104  98 - 110 mmol/L Final  . CO2 09/19/2018 28  20 - 32 mmol/L Final  . Calcium 09/19/2018 9.2  8.6 - 10.3 mg/dL Final  . Total Protein 09/19/2018 6.2  6.1 - 8.1 g/dL Final  . Albumin 09/19/2018 4.3  3.6 - 5.1 g/dL Final  . Globulin 09/19/2018 1.9  1.9 - 3.7 g/dL (calc) Final  . AG Ratio 09/19/2018 2.3  1.0 - 2.5 (calc) Final  . Total Bilirubin  09/19/2018 0.4  0.2 - 1.2 mg/dL Final  . Alkaline phosphatase (APISO) 09/19/2018 42  40 - 115 U/L Final  . AST 09/19/2018 18  10 - 35 U/L Final  . ALT 09/19/2018 15  9 - 46 U/L Final   Past Medical History:  Diagnosis Date  . 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 TONSILLECTOMY     Current Outpatient Medications on File Prior to Visit  Medication Sig Dispense Refill  . cyclobenzaprine (FLEXERIL) 10 MG tablet Take 1 tablet (10 mg total) by mouth 3 (three) times daily as needed for muscle spasms. 30 tablet 0  . fluticasone (FLONASE) 50 MCG/ACT nasal spray Place 2 sprays into both nostrils daily. 16 g 6  . ipratropium (ATROVENT) 0.03 % nasal spray Place 2 sprays into both nostrils every 12 (twelve) hours. 30 mL 12   No current facility-administered medications on file prior to visit.    Allergies  Allergen Reactions  . Bee Venom    Social History   Socioeconomic History  . Marital status: Married    Spouse name: Not on file  . Number of children: Not on file  . Years of education: Not on file  . Highest education level: Not on file  Occupational History  . Not on file  Social Needs  . Financial resource strain: Not on file  . Food insecurity:    Worry: Not on file    Inability: Not on file  . Transportation needs:    Medical: Not on file    Non-medical: Not on file  Tobacco Use  . Smoking status: Never Smoker  . Smokeless tobacco: Never Used  Substance and Sexual Activity  . Alcohol use: Yes    Comment: Occasional  . Drug use: No  . Sexual activity: Yes  Lifestyle  . Physical activity:    Days per week: Not on file    Minutes per session: Not on file  . Stress: Not on file  Relationships  . Social connections:    Talks on phone: Not on file    Gets together: Not on file    Attends religious service: Not on file    Active member of club or organization: Not on file    Attends meetings of clubs or organizations: Not  on file    Relationship status: Not on file  . Intimate partner violence:    Fear of current or ex partner: Not on file    Emotionally abused: Not on file  Physically abused: Not on file    Forced sexual activity: Not on file  Other Topics Concern  . Not on file  Social History Narrative  . Not on file   Family History  Problem Relation Age of Onset  . Diabetes Mother   . Vision loss Father   . Cancer Father 87       prostate  . Cancer Maternal Grandfather   . Colon cancer Neg Hx       Review of Systems  All other systems reviewed and are negative.      Objective:   Physical Exam  Constitutional: He is oriented to person, place, and time. He appears well-developed and well-nourished. No distress.  HENT:  Head: Normocephalic and atraumatic.  Right Ear: External ear normal.  Left Ear: External ear normal.  Nose: Nose normal.  Mouth/Throat: Oropharynx is clear and moist. No oropharyngeal exudate.  Eyes: Pupils are equal, round, and reactive to light. Conjunctivae and EOM are normal. Right eye exhibits no discharge. Left eye exhibits no discharge. No scleral icterus.  Neck: Normal range of motion. Neck supple. No JVD present. No tracheal deviation present. No thyromegaly present.  Cardiovascular: Normal rate, regular rhythm, normal heart sounds and intact distal pulses. Exam reveals no gallop and no friction rub.  No murmur heard. Pulmonary/Chest: Effort normal and breath sounds normal. No stridor. No respiratory distress. He has no wheezes. He has no rales. He exhibits no tenderness.  Abdominal: Soft. Bowel sounds are normal. He exhibits no distension and no mass. There is no tenderness. There is no rebound and no guarding.  Genitourinary: Rectum normal, prostate normal and penis normal.  Musculoskeletal: Normal range of motion. He exhibits no edema or tenderness.  Lymphadenopathy:    He has no cervical adenopathy.  Neurological: He is alert and oriented to person,  place, and time. He has normal reflexes. No cranial nerve deficit. He exhibits normal muscle tone. Coordination normal.  Skin: Skin is warm. No rash noted. He is not diaphoretic. No erythema. No pallor.  Psychiatric: He has a normal mood and affect. His behavior is normal. Judgment and thought content normal.  Vitals reviewed.  prostate exam is completely normal. There is no nodularity or asymmetry        Assessment & Plan:  Needs flu shot - Plan: Flu Vaccine QUAD 36+ mos IM  Routine general medical examination at a health care facility  Physical exam today is completely normal.  Ten-year risk of cardiovascular disease is less than 7.5% and therefore patient does not require statin.  I have recommended that he check his blood pressure more closely and notify me of the values in 1 to 2 weeks.  If persistently greater than 140/90, we could discuss medication.  Flu shot is up-to-date.  The remainder of his immunizations are up-to-date.  He declines HIV or hepatitis C screening.  I recommended using meloxicam as needed for neck pain.  I will add a PSA to his lab work although his prostate exam is normal

## 2018-09-23 LAB — CBC WITH DIFFERENTIAL/PLATELET
BASOS ABS: 23 {cells}/uL (ref 0–200)
Basophils Relative: 0.4 %
EOS ABS: 171 {cells}/uL (ref 15–500)
Eosinophils Relative: 3 %
HEMATOCRIT: 44.5 % (ref 38.5–50.0)
HEMOGLOBIN: 15.3 g/dL (ref 13.2–17.1)
LYMPHS ABS: 2257 {cells}/uL (ref 850–3900)
MCH: 30.4 pg (ref 27.0–33.0)
MCHC: 34.4 g/dL (ref 32.0–36.0)
MCV: 88.3 fL (ref 80.0–100.0)
MPV: 9.6 fL (ref 7.5–12.5)
Monocytes Relative: 10 %
NEUTROS ABS: 2679 {cells}/uL (ref 1500–7800)
Neutrophils Relative %: 47 %
Platelets: 228 10*3/uL (ref 140–400)
RBC: 5.04 10*6/uL (ref 4.20–5.80)
RDW: 13 % (ref 11.0–15.0)
Total Lymphocyte: 39.6 %
WBC mixed population: 570 cells/uL (ref 200–950)
WBC: 5.7 10*3/uL (ref 3.8–10.8)

## 2018-09-23 LAB — COMPREHENSIVE METABOLIC PANEL
AG RATIO: 2.3 (calc) (ref 1.0–2.5)
ALKALINE PHOSPHATASE (APISO): 42 U/L (ref 40–115)
ALT: 15 U/L (ref 9–46)
AST: 18 U/L (ref 10–35)
Albumin: 4.3 g/dL (ref 3.6–5.1)
BILIRUBIN TOTAL: 0.4 mg/dL (ref 0.2–1.2)
BUN: 21 mg/dL (ref 7–25)
CALCIUM: 9.2 mg/dL (ref 8.6–10.3)
CO2: 28 mmol/L (ref 20–32)
Chloride: 104 mmol/L (ref 98–110)
Creat: 0.92 mg/dL (ref 0.70–1.33)
Globulin: 1.9 g/dL (calc) (ref 1.9–3.7)
Glucose, Bld: 99 mg/dL (ref 65–99)
Potassium: 4.4 mmol/L (ref 3.5–5.3)
SODIUM: 141 mmol/L (ref 135–146)
Total Protein: 6.2 g/dL (ref 6.1–8.1)

## 2018-09-23 LAB — LIPID PANEL
Cholesterol: 219 mg/dL — ABNORMAL HIGH (ref <200)
HDL: 70 mg/dL (ref >40)
LDL Cholesterol (Calc): 135 mg/dL (calc) — ABNORMAL HIGH
Non-HDL Cholesterol (Calc): 149 mg/dL (calc) — ABNORMAL HIGH (ref <130)
TRIGLYCERIDES: 53 mg/dL (ref <150)
Total CHOL/HDL Ratio: 3.1 (calc) (ref <5.0)

## 2018-09-23 LAB — TEST AUTHORIZATION

## 2018-09-23 LAB — PSA: PSA: 1.5 ng/mL (ref <=4.0)

## 2019-08-07 IMAGING — DX DG CERVICAL SPINE COMPLETE 4+V
5 series · 5 of 5 positions shown · non-contrast
Comparison: None.

CLINICAL DATA: Posterior neck pain and stiffness following a
twisting injury 2 weeks ago.

EXAM:
CERVICAL SPINE - COMPLETE 4+ VIEW

[c-spine lat]
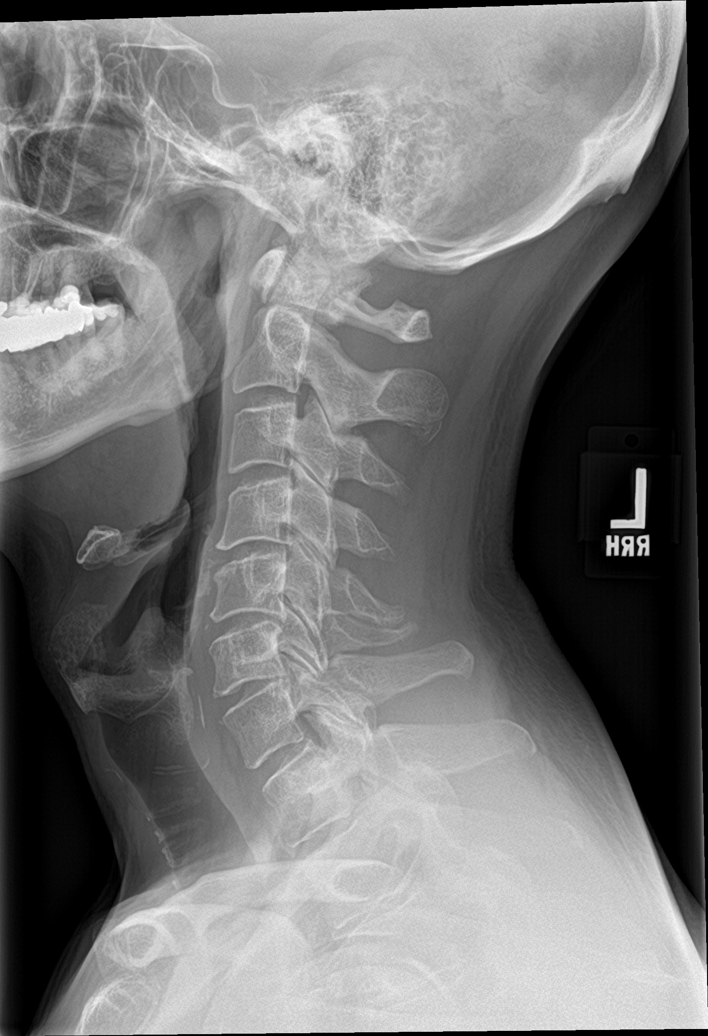

[c-spine obl (1 of 2)]
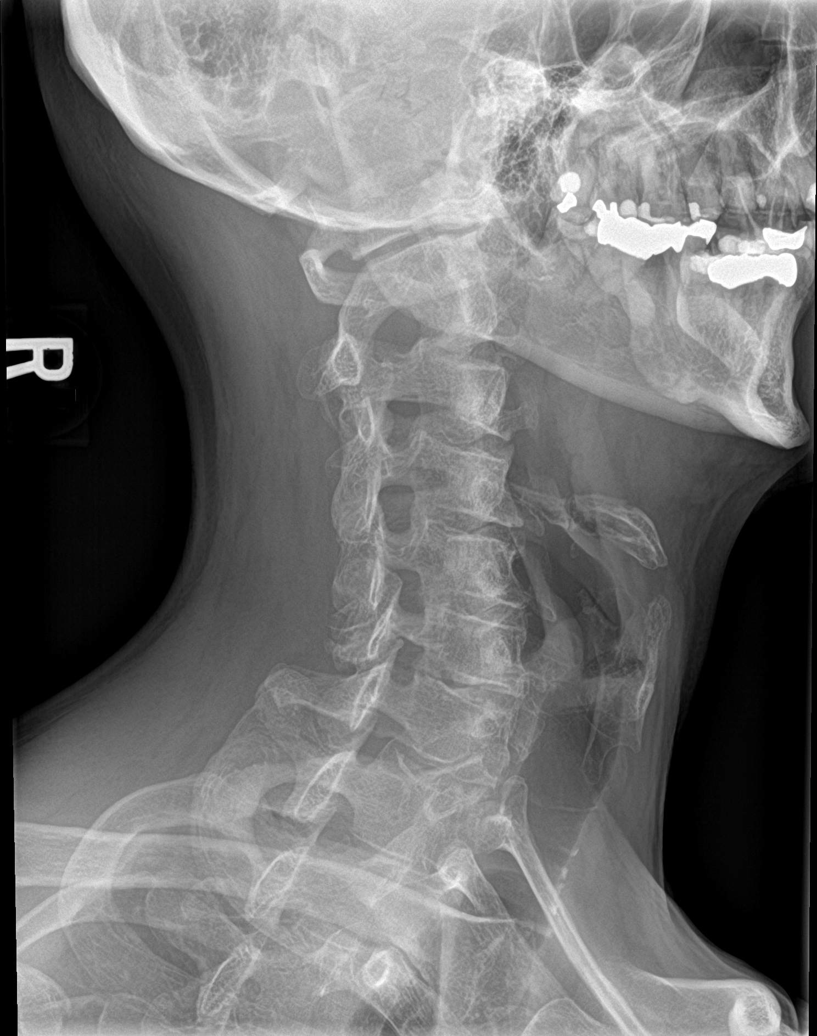

[c-spine obl (2 of 2)]
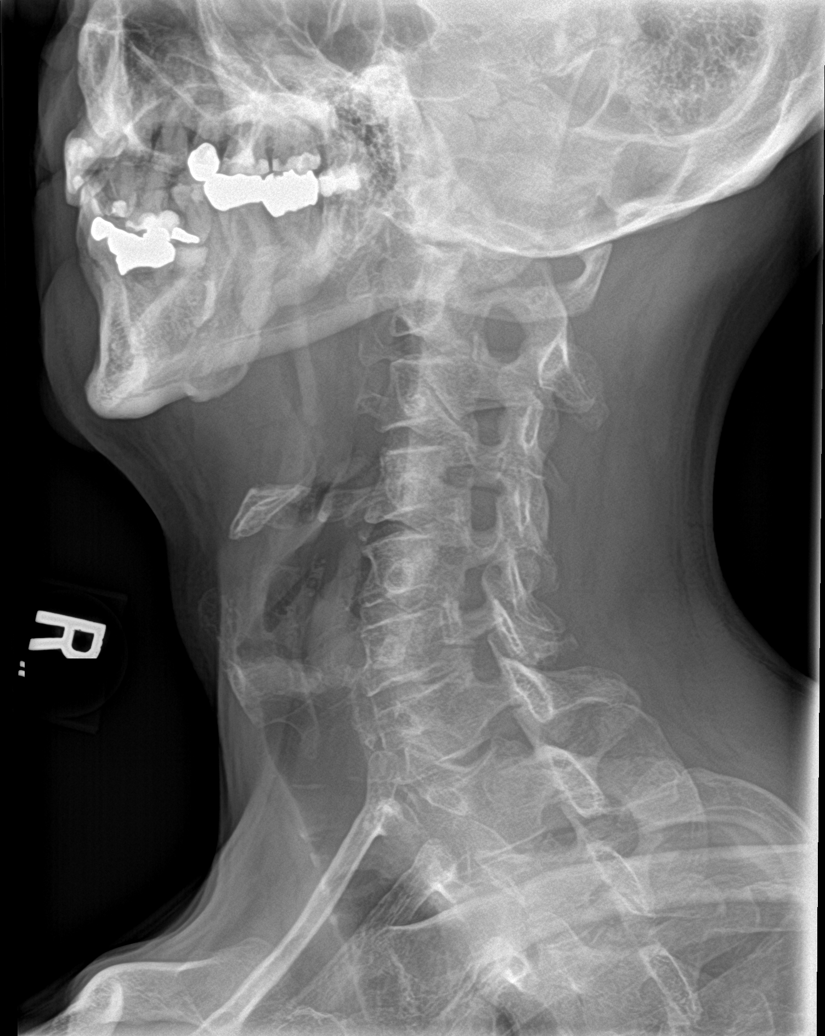

[c-spine ap]
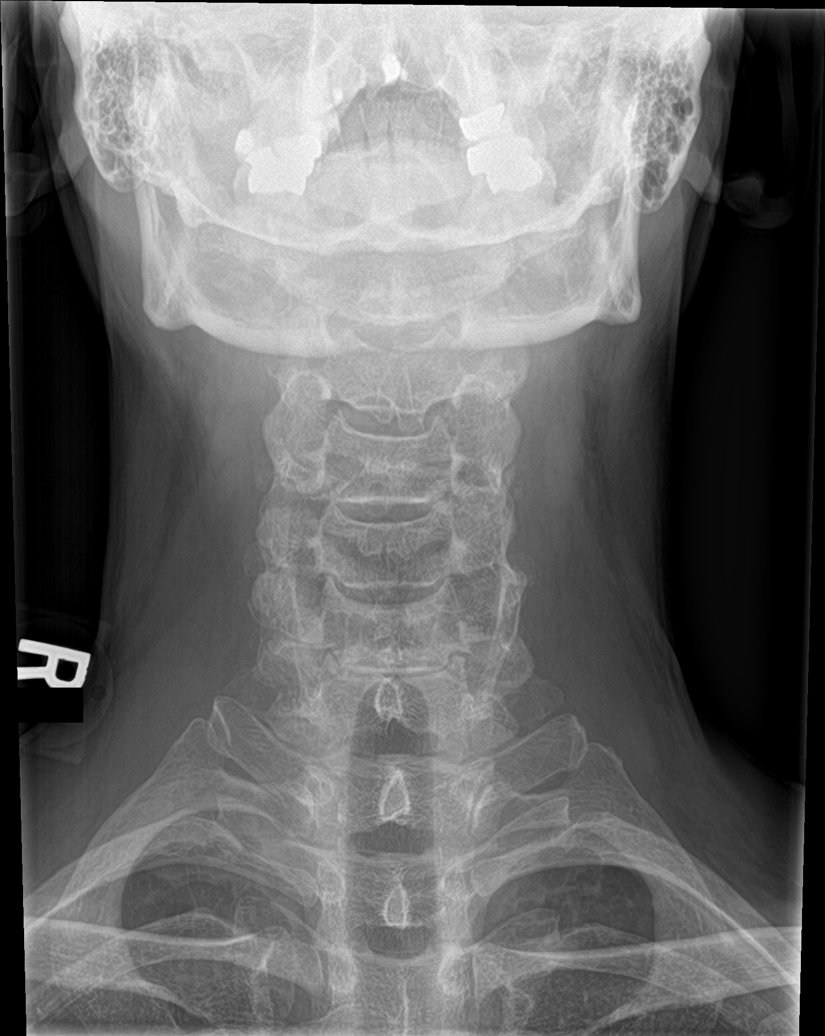

[c-spine open mouth]
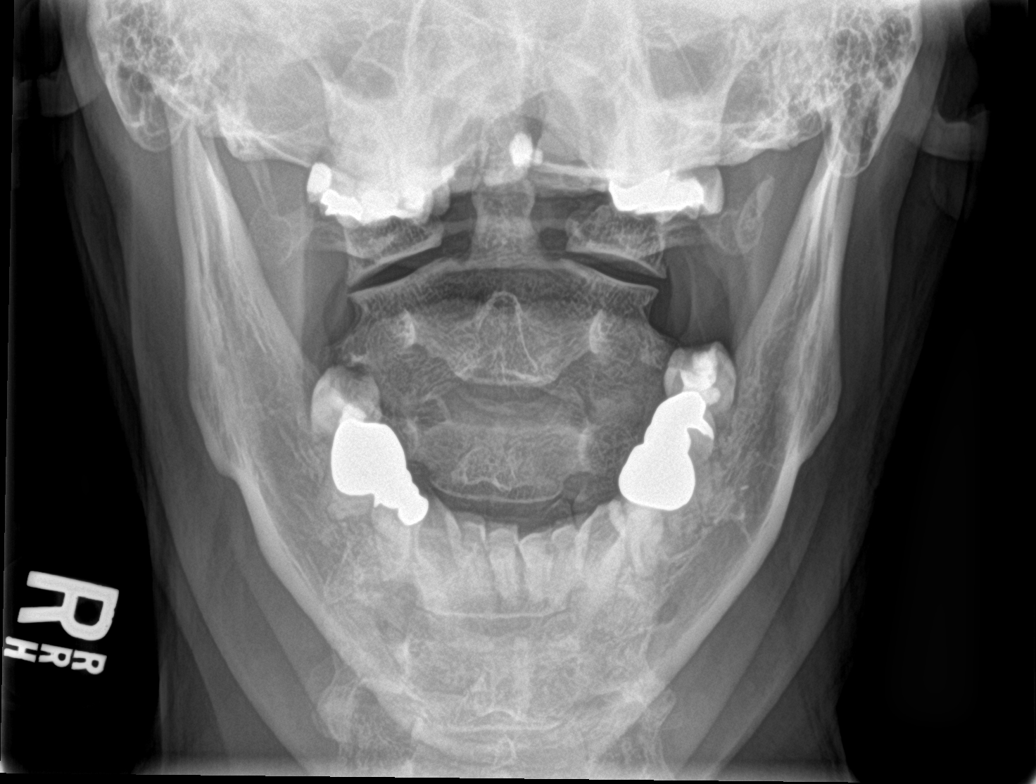

[5 of 5 positions shown; findings below may reference images not displayed]

FINDINGS: Mild disc space narrowing and mild anterior spur formation at the
C4-5, C5-6 and C6-7 levels with mild posterior spur formation at the
C5-6 and C6-7 levels. Mild facet and uncinate spurring on the right
at the C6-7 level, causing mild foraminal stenosis. Mild facet
hypertrophy producing mild foraminal stenosis on the right at the
C5-6 and C7-T1 levels. Similar changes on the left at the C5-6 and
C6-7 levels with mild foraminal stenosis. Mild facet degenerative
changes at multiple levels. No prevertebral soft tissue swelling,
fractures or subluxations.
IMPRESSION: 1. No fracture or subluxation.
2. Degenerative changes, as described above.

## 2019-09-28 ENCOUNTER — Other Ambulatory Visit: Payer: BC Managed Care – PPO

## 2019-09-28 ENCOUNTER — Other Ambulatory Visit: Payer: Self-pay

## 2019-09-28 DIAGNOSIS — Z0000 Encounter for general adult medical examination without abnormal findings: Secondary | ICD-10-CM

## 2019-09-28 DIAGNOSIS — Z125 Encounter for screening for malignant neoplasm of prostate: Secondary | ICD-10-CM

## 2019-09-29 LAB — COMPREHENSIVE METABOLIC PANEL
AG Ratio: 2 (calc) (ref 1.0–2.5)
ALT: 17 U/L (ref 9–46)
AST: 18 U/L (ref 10–35)
Albumin: 4.3 g/dL (ref 3.6–5.1)
Alkaline phosphatase (APISO): 43 U/L (ref 35–144)
BUN: 22 mg/dL (ref 7–25)
CO2: 26 mmol/L (ref 20–32)
Calcium: 9.2 mg/dL (ref 8.6–10.3)
Chloride: 102 mmol/L (ref 98–110)
Creat: 0.99 mg/dL (ref 0.70–1.33)
Globulin: 2.2 g/dL (calc) (ref 1.9–3.7)
Glucose, Bld: 99 mg/dL (ref 65–99)
Potassium: 4.4 mmol/L (ref 3.5–5.3)
Sodium: 140 mmol/L (ref 135–146)
Total Bilirubin: 0.6 mg/dL (ref 0.2–1.2)
Total Protein: 6.5 g/dL (ref 6.1–8.1)

## 2019-09-29 LAB — CBC WITH DIFFERENTIAL/PLATELET
Absolute Monocytes: 500 cells/uL (ref 200–950)
Basophils Absolute: 29 cells/uL (ref 0–200)
Basophils Relative: 0.6 %
Eosinophils Absolute: 127 cells/uL (ref 15–500)
Eosinophils Relative: 2.6 %
HCT: 47.6 % (ref 38.5–50.0)
Hemoglobin: 15.9 g/dL (ref 13.2–17.1)
Lymphs Abs: 1833 cells/uL (ref 850–3900)
MCH: 29.4 pg (ref 27.0–33.0)
MCHC: 33.4 g/dL (ref 32.0–36.0)
MCV: 88 fL (ref 80.0–100.0)
MPV: 9.7 fL (ref 7.5–12.5)
Monocytes Relative: 10.2 %
Neutro Abs: 2411 cells/uL (ref 1500–7800)
Neutrophils Relative %: 49.2 %
Platelets: 235 10*3/uL (ref 140–400)
RBC: 5.41 10*6/uL (ref 4.20–5.80)
RDW: 12.8 % (ref 11.0–15.0)
Total Lymphocyte: 37.4 %
WBC: 4.9 10*3/uL (ref 3.8–10.8)

## 2019-09-29 LAB — LIPID PANEL
Cholesterol: 216 mg/dL — ABNORMAL HIGH (ref <200)
HDL: 57 mg/dL (ref >=40)
LDL Cholesterol (Calc): 143 mg/dL (calc) — ABNORMAL HIGH
Non-HDL Cholesterol (Calc): 159 mg/dL (calc) — ABNORMAL HIGH (ref <130)
Total CHOL/HDL Ratio: 3.8 (calc) (ref <5.0)
Triglycerides: 72 mg/dL (ref <150)

## 2019-09-29 LAB — PSA: PSA: 1.9 ng/mL (ref <=4.0)

## 2019-09-30 ENCOUNTER — Encounter: Payer: BLUE CROSS/BLUE SHIELD | Admitting: Family Medicine

## 2019-10-02 ENCOUNTER — Encounter: Payer: Self-pay | Admitting: Family Medicine

## 2019-10-02 ENCOUNTER — Other Ambulatory Visit: Payer: Self-pay

## 2019-10-02 ENCOUNTER — Ambulatory Visit (INDEPENDENT_AMBULATORY_CARE_PROVIDER_SITE_OTHER): Payer: BC Managed Care – PPO | Admitting: Family Medicine

## 2019-10-02 VITALS — BP 118/80 | HR 58 | Temp 97.7°F | Ht 72.0 in | Wt 178.0 lb

## 2019-10-02 DIAGNOSIS — M542 Cervicalgia: Secondary | ICD-10-CM

## 2019-10-02 DIAGNOSIS — Z0001 Encounter for general adult medical examination with abnormal findings: Secondary | ICD-10-CM | POA: Diagnosis not present

## 2019-10-02 DIAGNOSIS — Z0000 Encounter for general adult medical examination without abnormal findings: Secondary | ICD-10-CM

## 2019-10-02 MED ORDER — DICLOFENAC SODIUM 75 MG PO TBEC: 75.0000 mg | DELAYED_RELEASE_TABLET | Freq: Two times a day (BID) | ORAL | 0 refills | Status: DC

## 2019-10-02 NOTE — Progress Notes (Signed)
Subjective:    Patient ID: Carlos [name], male    DOB: [birth year], 58 y.o.   MRN: [id number]  HPI Patient is here today for complete physical exam.  His last colonoscopy was in 2014 and is good until 2024.  His PSA was recently checked and found to be 1.9 up from 1.5 last year but he has no lower urinary tract symptoms.  He is already had his flu shot.  His most recent lab work is listed below.  He denies any medical concerns other than pain radiating from the left side of his neck down his left arm into his left elbow and also into the subscapular area on the left side.  This is been going on now for 2 weeks.  Is gradually been getting better.  He denies any left arm weakness however he does complain with pain with range of motion in his neck.  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  Symptoms now are consistent with possible cervical radiculopathy Lab on 09/28/2019  Component Date Value Ref Range Status  . WBC 09/28/2019 4.9  3.8 - 10.8 Thousand/uL Final  . RBC 09/28/2019 5.41  4.20 - 5.80 Million/uL Final  . Hemoglobin 09/28/2019 15.9  13.2 - 17.1 g/dL Final  . HCT 09/28/2019 47.6  38.5 - 50.0 % Final  . MCV 09/28/2019 88.0  80.0 - 100.0 fL Final  . MCH 09/28/2019 29.4  27.0 - 33.0 pg Final  . MCHC 09/28/2019 33.4  32.0 - 36.0 g/dL Final  . RDW 09/28/2019 12.8  11.0 - 15.0 % Final  . Platelets 09/28/2019 235  140 - 400 Thousand/uL Final  . MPV 09/28/2019 9.7  7.5 - 12.5 fL Final  . Neutro Abs 09/28/2019 2,411  1,500 - 7,800 cells/uL Final  . Lymphs Abs 09/28/2019 1,833  850 - 3,900 cells/uL Final  . Absolute Monocytes 09/28/2019 500  200 - 950 cells/uL Final  . Eosinophils Absolute 09/28/2019 127  15 - 500 cells/uL Final  . Basophils Absolute 09/28/2019 29  0 - 200 cells/uL Final  . Neutrophils Relative % 09/28/2019 49.2  % Final  . Total Lymphocyte 09/28/2019 37.4  % Final  . Monocytes Relative 09/28/2019 10.2  %  Final  . Eosinophils Relative 09/28/2019 2.6  % Final  . Basophils Relative 09/28/2019 0.6  % Final  . Glucose, Bld 09/28/2019 99  65 - 99 mg/dL Final   Comment: .            Fasting reference interval .   . BUN 09/28/2019 22  7 - 25 mg/dL Final  . Creat 09/28/2019 0.99  0.70 - 1.33 mg/dL Final   Comment: For patients >34 years of age, the reference limit for Creatinine is approximately 13% higher for people identified as African-American. .   Havery Moros Ratio [id number] NOT APPLICABLE  6 - 22 (calc) Final  . Sodium 09/28/2019 140  135 - 146 mmol/L Final  . Potassium 09/28/2019 4.4  3.5 - 5.3 mmol/L Final  . Chloride 09/28/2019 102  98 - 110 mmol/L Final  . CO2 09/28/2019 26  20 - 32 mmol/L Final  . Calcium 09/28/2019 9.2  8.6 - 10.3 mg/dL Final  . Total Protein 09/28/2019 6.5  6.1 - 8.1 g/dL Final  . Albumin 09/28/2019 4.3  3.6 - 5.1 g/dL Final  . Globulin 09/28/2019 2.2  1.9 - 3.7 g/dL (calc) Final  . AG Ratio 09/28/2019 2.0  1.0 -  2.5 (calc) Final  . Total Bilirubin 09/28/2019 0.6  0.2 - 1.2 mg/dL Final  . Alkaline phosphatase (APISO) 09/28/2019 43  35 - 144 U/L Final  . AST 09/28/2019 18  10 - 35 U/L Final  . ALT 09/28/2019 17  9 - 46 U/L Final  . Cholesterol 09/28/2019 216* <200 mg/dL Final  . HDL 09/28/2019 57  > OR = 40 mg/dL Final  . Triglycerides 09/28/2019 72  <150 mg/dL Final  . LDL Cholesterol (Calc) 09/28/2019 143* mg/dL (calc) Final   Comment: Reference range: <100 . Desirable range <100 mg/dL for primary prevention;   <70 mg/dL for patients with CHD or diabetic patients  with > or = 2 CHD risk factors. Marland Kitchen LDL-C is now calculated using the Martin-Hopkins  calculation, which is a validated novel method providing  better accuracy than the Friedewald equation in the  estimation of LDL-C.  Cresenciano Genre et al. Annamaria Helling. [id number]): 2061-2068  (http://education.[facility].com/faq/FAQ164)   . Total CHOL/HDL Ratio 09/28/2019 3.8  <5.0 (calc) Final  . Non-HDL  Cholesterol (Calc) 09/28/2019 159* <130 mg/dL (calc) Final   Comment: For patients with diabetes plus 1 major ASCVD risk  factor, treating to a non-HDL-C goal of <100 mg/dL  (LDL-C of <70 mg/dL) is considered a therapeutic  option.   Marland Kitchen PSA 09/28/2019 1.9  < OR = 4.0 ng/mL Final   Comment: The total PSA value from this assay system is  standardized against the WHO standard. The test  result will be approximately 20% lower when compared  to the equimolar-standardized total PSA (Beckman  Coulter). Comparison of serial PSA results should be  interpreted with this fact in mind. . This test was performed using the Siemens  chemiluminescent method. Values obtained from  different assay methods cannot be used interchangeably. PSA levels, regardless of value, should not be interpreted as absolute evidence of the presence or absence of disease.    Past Medical History:  Diagnosis Date  . 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 TONSILLECTOMY     No current outpatient medications on file prior to visit.   No current facility-administered medications on file prior to visit.    Allergies  Allergen Reactions  . Bee Venom    Social History   Socioeconomic History  . Marital status: Married    Spouse name: Not on file  . Number of children: Not on file  . Years of education: Not on file  . Highest education level: Not on file  Occupational History  . Not on file  Social Needs  . Financial resource strain: Not on file  . Food insecurity    Worry: Not on file    Inability: Not on file  . Transportation needs    Medical: Not on file    Non-medical: Not on file  Tobacco Use  . Smoking status: Never Smoker  . Smokeless tobacco: Never Used  Substance and Sexual Activity  . Alcohol use: Yes    Comment: Occasional  . Drug use: No  . Sexual activity: Yes  Lifestyle  . Physical activity    Days per week: Not on file    Minutes per  session: Not on file  . Stress: Not on file  Relationships  . Social Herbalist on phone: Not on file    Gets together: Not on file    Attends religious service: Not on file    Active member  of club or organization: Not on file    Attends meetings of clubs or organizations: Not on file    Relationship status: Not on file  . Intimate partner violence    Fear of current or ex partner: Not on file    Emotionally abused: Not on file    Physically abused: Not on file    Forced sexual activity: Not on file  Other Topics Concern  . Not on file  Social History Narrative  . Not on file   Family History  Problem Relation Age of Onset  . Diabetes Mother   . Vision loss Father   . Cancer Father 76       prostate  . Cancer Maternal Grandfather   . Colon cancer Neg Hx       Review of Systems  All other systems reviewed and are negative.      Objective:   Physical Exam  Constitutional: He is oriented to person, place, and time. He appears well-developed and well-nourished. No distress.  HENT:  Head: Normocephalic and atraumatic.  Right Ear: External ear normal.  Left Ear: External ear normal.  Nose: Nose normal.  Mouth/Throat: Oropharynx is clear and moist. No oropharyngeal exudate.  Eyes: Pupils are equal, round, and reactive to light. Conjunctivae and EOM are normal. Right eye exhibits no discharge. Left eye exhibits no discharge. No scleral icterus.  Neck: Normal range of motion. Neck supple. No JVD present. No tracheal deviation present. No thyromegaly present.  Cardiovascular: Normal rate, regular rhythm, normal heart sounds and intact distal pulses. Exam reveals no gallop and no friction rub.  No murmur heard. Pulmonary/Chest: Effort normal and breath sounds normal. No stridor. No respiratory distress. He has no wheezes. He has no rales. He exhibits no tenderness.  Abdominal: Soft. Bowel sounds are normal. He exhibits no distension and no mass. There is no  abdominal tenderness. There is no rebound and no guarding.  Musculoskeletal:        General: No edema.     Cervical back: He exhibits decreased range of motion and tenderness.  Lymphadenopathy:    He has no cervical adenopathy.  Neurological: He is alert and oriented to person, place, and time. He has normal reflexes. No cranial nerve deficit. He exhibits normal muscle tone. Coordination normal.  Skin: Skin is warm. No rash noted. He is not diaphoretic. No erythema. No pallor.  Psychiatric: He has a normal mood and affect. His behavior is normal. Judgment and thought content normal.  Vitals reviewed.        Assessment & Plan:  Routine general medical examination at a health care facility  Neck pain  Physical exam is completely normal.  10-year risk of cardiovascular disease is approximately 4% and therefore the patient does not require statin therapy.  Colon cancer screening is up-to-date.  PSA is normal.  Immunizations are up-to-date.  I did recommend the shingles vaccine when available.  I recommended the patient try diclofenac 75 mg p.o. twice daily for cervical radiculopathy.  If not improving, the next step would be to obtain an MRI of the cervical spine.

## 2020-09-30 ENCOUNTER — Other Ambulatory Visit: Payer: BC Managed Care – PPO

## 2020-09-30 ENCOUNTER — Other Ambulatory Visit: Payer: Self-pay

## 2020-09-30 DIAGNOSIS — Z1159 Encounter for screening for other viral diseases: Secondary | ICD-10-CM

## 2020-09-30 DIAGNOSIS — Z13220 Encounter for screening for lipoid disorders: Secondary | ICD-10-CM

## 2020-09-30 DIAGNOSIS — Z136 Encounter for screening for cardiovascular disorders: Secondary | ICD-10-CM | POA: Diagnosis not present

## 2020-09-30 DIAGNOSIS — Z125 Encounter for screening for malignant neoplasm of prostate: Secondary | ICD-10-CM

## 2020-10-01 LAB — CBC WITH DIFFERENTIAL/PLATELET
Absolute Monocytes: 609 cells/uL (ref 200–950)
Basophils Absolute: 29 cells/uL (ref 0–200)
Basophils Relative: 0.5 %
Eosinophils Absolute: 157 cells/uL (ref 15–500)
HCT: 46.5 % (ref 38.5–50.0)
Hemoglobin: 15.9 g/dL (ref 13.2–17.1)
Lymphs Abs: 2210 cells/uL (ref 850–3900)
MCHC: 34.2 g/dL (ref 32.0–36.0)
MCV: 89.8 fL (ref 80.0–100.0)
Neutrophils Relative %: 48.2 %
Platelets: 248 10*3/uL (ref 140–400)
RBC: 5.18 10*6/uL (ref 4.20–5.80)
Total Lymphocyte: 38.1 %
WBC: 5.8 10*3/uL (ref 3.8–10.8)

## 2020-10-01 LAB — COMPLETE METABOLIC PANEL WITH GFR
Alkaline phosphatase (APISO): 48 U/L (ref 35–144)
BUN: 15 mg/dL (ref 7–25)
CO2: 26 mmol/L (ref 20–32)
Creat: 1.04 mg/dL (ref 0.70–1.33)
Globulin: 2.1 g/dL (calc) (ref 1.9–3.7)
Potassium: 4.7 mmol/L (ref 3.5–5.3)
Total Bilirubin: 0.4 mg/dL (ref 0.2–1.2)

## 2020-10-01 LAB — PSA: PSA: 3.81 ng/mL (ref <=4.0)

## 2020-10-01 LAB — LIPID PANEL
Cholesterol: 201 mg/dL — ABNORMAL HIGH (ref <200)
Non-HDL Cholesterol (Calc): 146 mg/dL (calc) — ABNORMAL HIGH (ref <130)
Triglycerides: 90 mg/dL (ref <150)

## 2020-10-03 LAB — LIPID PANEL
HDL: 55 mg/dL (ref >=40)
LDL Cholesterol (Calc): 127 mg/dL (calc) — ABNORMAL HIGH
Total CHOL/HDL Ratio: 3.7 (calc) (ref <5.0)

## 2020-10-03 LAB — COMPLETE METABOLIC PANEL WITH GFR
AG Ratio: 2 (calc) (ref 1.0–2.5)
ALT: 16 U/L (ref 9–46)
AST: 18 U/L (ref 10–35)
Albumin: 4.3 g/dL (ref 3.6–5.1)
Calcium: 9.3 mg/dL (ref 8.6–10.3)
Chloride: 102 mmol/L (ref 98–110)
GFR, Est African American: 91 mL/min/{1.73_m2} (ref >=60)
GFR, Est Non African American: 79 mL/min/{1.73_m2} (ref >=60)
Glucose, Bld: 105 mg/dL — ABNORMAL HIGH (ref 65–99)
Sodium: 139 mmol/L (ref 135–146)
Total Protein: 6.4 g/dL (ref 6.1–8.1)

## 2020-10-03 LAB — CBC WITH DIFFERENTIAL/PLATELET
Eosinophils Relative: 2.7 %
MCH: 30.7 pg (ref 27.0–33.0)
MPV: 9.2 fL (ref 7.5–12.5)
Monocytes Relative: 10.5 %
Neutro Abs: 2796 cells/uL (ref 1500–7800)
RDW: 12.5 % (ref 11.0–15.0)

## 2020-10-03 LAB — HEPATITIS C ANTIBODY
Hepatitis C Ab: NONREACTIVE
SIGNAL TO CUT-OFF: 0.01 (ref <1.00)

## 2020-10-06 ENCOUNTER — Other Ambulatory Visit: Payer: Self-pay

## 2020-10-06 ENCOUNTER — Ambulatory Visit (INDEPENDENT_AMBULATORY_CARE_PROVIDER_SITE_OTHER): Payer: BC Managed Care – PPO | Admitting: Family Medicine

## 2020-10-06 VITALS — BP 120/70 | HR 56 | Temp 98.4°F | Ht 72.0 in | Wt 188.0 lb

## 2020-10-06 DIAGNOSIS — R972 Elevated prostate specific antigen [PSA]: Secondary | ICD-10-CM

## 2020-10-06 DIAGNOSIS — Z0001 Encounter for general adult medical examination with abnormal findings: Secondary | ICD-10-CM

## 2020-10-06 DIAGNOSIS — Z0000 Encounter for general adult medical examination without abnormal findings: Secondary | ICD-10-CM

## 2020-10-06 NOTE — Progress Notes (Signed)
Subjective:    Patient ID: Carlos [name], male    DOB: July 26, 1961, 59 y.o.   MRN: [id number]  HPI Patient is here today for complete physical exam.  His last colonoscopy was in 2014 and is good until 2024.  Patient's PSA has risen substantially since last year.  It was 1.9 last year is 3.8 this year.  He has a family history of prostate cancer in his father.  His father was diagnosed around age 86 or 80.  Patient denies any dysuria hematuria urgency or frequency.  On exam today I do feel like there may be a nodular area in the right lobe of the prostate near 3:00 with the patient leaning over the exam table.  He denies any tenderness or pain at that area.  His immunizations are up-to-date other than he is due for a booster on his Covid vaccination. Lab on 09/30/2020  Component Date Value Ref Range Status  . WBC 09/30/2020 5.8  3.8 - 10.8 Thousand/uL Final  . RBC 09/30/2020 5.18  4.20 - 5.80 Million/uL Final  . Hemoglobin 09/30/2020 15.9  13.2 - 17.1 g/dL Final  . HCT 09/30/2020 46.5  38 - 50 % Final  . MCV 09/30/2020 89.8  80.0 - 100.0 fL Final  . MCH 09/30/2020 30.7  27.0 - 33.0 pg Final  . MCHC 09/30/2020 34.2  32.0 - 36.0 g/dL Final  . RDW 09/30/2020 12.5  11.0 - 15.0 % Final  . Platelets 09/30/2020 248  140 - 400 Thousand/uL Final  . MPV 09/30/2020 9.2  7.5 - 12.5 fL Final  . Neutro Abs 09/30/2020 2,796  1,500 - 7,800 cells/uL Final  . Lymphs Abs 09/30/2020 2,210  850 - 3,900 cells/uL Final  . Absolute Monocytes 09/30/2020 609  200 - 950 cells/uL Final  . Eosinophils Absolute 09/30/2020 157  15.0 - 500.0 cells/uL Final  . Basophils Absolute 09/30/2020 29  0.0 - 200.0 cells/uL Final  . Neutrophils Relative % 09/30/2020 48.2  % Final  . Total Lymphocyte 09/30/2020 38.1  % Final  . Monocytes Relative 09/30/2020 10.5  % Final  . Eosinophils Relative 09/30/2020 2.7  % Final  . Basophils Relative 09/30/2020 0.5  % Final  . Glucose, Bld 09/30/2020 105* 65 - 99 mg/dL Final   Comment: .             Fasting reference interval . For someone without known diabetes, a glucose value between 100 and 125 mg/dL is consistent with prediabetes and should be confirmed with a follow-up test. .   . BUN 09/30/2020 15  7 - 25 mg/dL Final  . Creat 09/30/2020 1.04  0.70 - 1.33 mg/dL Final   Comment: For patients >100 years of age, the reference limit for Creatinine is approximately 13% higher for people identified as African-American. .   . GFR, Est Non African American 09/30/2020 79  > OR = 60 mL/min/1.47m2 Final  . GFR, Est African American 09/30/2020 91  > OR = 60 mL/min/1.65m2 Final  . BUN/Creatinine Ratio 87/68/1157 NOT APPLICABLE  6 - 22 (calc) Final  . Sodium 09/30/2020 139  135 - 146 mmol/L Final  . Potassium 09/30/2020 4.7  3.5 - 5.3 mmol/L Final  . Chloride 09/30/2020 102  98 - 110 mmol/L Final  . CO2 09/30/2020 26  20 - 32 mmol/L Final  . Calcium 09/30/2020 9.3  8.6 - 10.3 mg/dL Final  . Total Protein 09/30/2020 6.4  6.1 - 8.1 g/dL Final  . Albumin 09/30/2020 4.3  3.6 -  5.1 g/dL Final  . Globulin 09/30/2020 2.1  1.9 - 3.7 g/dL (calc) Final  . AG Ratio 09/30/2020 2.0  1.0 - 2.5 (calc) Final  . Total Bilirubin 09/30/2020 0.4  0.2 - 1.2 mg/dL Final  . Alkaline phosphatase (APISO) 09/30/2020 48  35 - 144 U/L Final  . AST 09/30/2020 18  10 - 35 U/L Final  . ALT 09/30/2020 16  9 - 46 U/L Final  . Hepatitis C Ab 09/30/2020 NON-REACTIVE  NON-REACTI Final  . SIGNAL TO CUT-OFF 09/30/2020 0.01  <1.00 Final   Comment: . HCV antibody was non-reactive. There is no laboratory  evidence of HCV infection. . In most cases, no further action is required. However, if recent HCV exposure is suspected, a test for HCV RNA (test code [phone]) is suggested. . For additional information please refer to http://education.[facility].com/faq/FAQ22v1 (This link is being provided for informational/ educational purposes only.) .   Marland Kitchen Cholesterol 09/30/2020 201* <200 mg/dL Final  . HDL  09/30/2020 55  > OR = 40 mg/dL Final  . Triglycerides 09/30/2020 90  <150 mg/dL Final  . LDL Cholesterol (Calc) 09/30/2020 127* mg/dL (calc) Final   Comment: Reference range: <100 . Desirable range <100 mg/dL for primary prevention;   <70 mg/dL for patients with CHD or diabetic patients  with > or = 2 CHD risk factors. Marland Kitchen LDL-C is now calculated using the Martin-Hopkins  calculation, which is a validated novel method providing  better accuracy than the Friedewald equation in the  estimation of LDL-C.  Cresenciano Genre et al. Annamaria Helling. [id number];453(64): 2061-2068  (http://education.[facility].com/faq/FAQ164)   . Total CHOL/HDL Ratio 09/30/2020 3.7  <5.0 (calc) Final  . Non-HDL Cholesterol (Calc) 09/30/2020 146* <130 mg/dL (calc) Final   Comment: For patients with diabetes plus 1 major ASCVD risk  factor, treating to a non-HDL-C goal of <100 mg/dL  (LDL-C of <70 mg/dL) is considered a therapeutic  option.   Marland Kitchen PSA 09/30/2020 3.81  < OR = 4.0 ng/mL Final   Comment: The total PSA value from this assay system is  standardized against the WHO standard. The test  result will be approximately 20% lower when compared  to the equimolar-standardized total PSA (Beckman  Coulter). Comparison of serial PSA results should be  interpreted with this fact in mind. . This test was performed using the Siemens  chemiluminescent method. Values obtained from  different assay methods cannot be used interchangeably. PSA levels, regardless of value, should not be interpreted as absolute evidence of the presence or absence of disease.    Past Medical History:  Diagnosis Date  . 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 TONSILLECTOMY     Current Outpatient Medications on File Prior to Visit  Medication Sig Dispense Refill  . diclofenac (VOLTAREN) 75 MG EC tablet Take 1 tablet (75 mg total) by mouth 2 (two) times daily. 30 tablet 0   No current  facility-administered medications on file prior to visit.   Allergies  Allergen Reactions  . Bee Venom    Social History   Socioeconomic History  . Marital status: Married    Spouse name: Not on file  . Number of children: Not on file  . Years of education: Not on file  . Highest education level: Not on file  Occupational History  . Not on file  Tobacco Use  . Smoking status: Never Smoker  . Smokeless tobacco: Never Used  Substance and Sexual Activity  .  Alcohol use: Yes    Comment: Occasional  . Drug use: No  . Sexual activity: Yes  Other Topics Concern  . Not on file  Social History Narrative  . Not on file   Social Determinants of Health   Financial Resource Strain:   . Difficulty of Paying Living Expenses: Not on file  Food Insecurity:   . Worried About Charity fundraiser in the Last Year: Not on file  . Ran Out of Food in the Last Year: Not on file  Transportation Needs:   . Lack of Transportation (Medical): Not on file  . Lack of Transportation (Non-Medical): Not on file  Physical Activity:   . Days of Exercise per Week: Not on file  . Minutes of Exercise per Session: Not on file  Stress:   . Feeling of Stress : Not on file  Social Connections:   . Frequency of Communication with Friends and Family: Not on file  . Frequency of Social Gatherings with Friends and Family: Not on file  . Attends Religious Services: Not on file  . Active Member of Clubs or Organizations: Not on file  . Attends Archivist Meetings: Not on file  . Marital Status: Not on file  Intimate Partner Violence:   . Fear of Current or Ex-Partner: Not on file  . Emotionally Abused: Not on file  . Physically Abused: Not on file  . Sexually Abused: Not on file   Family History  Problem Relation Age of Onset  . Diabetes Mother   . Vision loss Father   . Cancer Father 91       prostate  . Cancer Maternal Grandfather   . Colon cancer Neg Hx       Review of Systems  All  other systems reviewed and are negative.      Objective:   Physical Exam Vitals reviewed.  Constitutional:      General: He is not in acute distress.    Appearance: He is well-developed. He is not diaphoretic.  HENT:     Head: Normocephalic and atraumatic.     Right Ear: External ear normal.     Left Ear: External ear normal.     Nose: Nose normal.     Mouth/Throat:     Pharynx: No oropharyngeal exudate.  Eyes:     General: No scleral icterus.       Right eye: No discharge.        Left eye: No discharge.     Conjunctiva/sclera: Conjunctivae normal.     Pupils: Pupils are equal, round, and reactive to light.  Neck:     Thyroid: No thyromegaly.     Vascular: No JVD.     Trachea: No tracheal deviation.  Cardiovascular:     Rate and Rhythm: Normal rate and regular rhythm.     Heart sounds: Normal heart sounds. No murmur heard.  No friction rub. No gallop.   Pulmonary:     Effort: Pulmonary effort is normal. No respiratory distress.     Breath sounds: Normal breath sounds. No stridor. No wheezing or rales.  Chest:     Chest wall: No tenderness.  Abdominal:     General: Bowel sounds are normal. There is no distension.     Palpations: Abdomen is soft. There is no mass.     Tenderness: There is no abdominal tenderness. There is no guarding or rebound.  Genitourinary:    Prostate: Enlarged and nodules present. Not tender.  Rectum: Normal.  Musculoskeletal:     Cervical back: Normal range of motion and neck supple.  Lymphadenopathy:     Cervical: No cervical adenopathy.  Skin:    General: Skin is warm.     Coloration: Skin is not pale.     Findings: No erythema or rash.  Neurological:     Mental Status: He is alert and oriented to person, place, and time.     Cranial Nerves: No cranial nerve deficit.     Motor: No abnormal muscle tone.     Coordination: Coordination normal.     Deep Tendon Reflexes: Reflexes are normal and symmetric.  Psychiatric:        Behavior:  Behavior normal.        Thought Content: Thought content normal.        Judgment: Judgment normal.                        Assessment & Plan:  Routine general medical examination at a health care facility  Elevated PSA - Plan: Ambulatory referral to Urology  Calculate his 10-year risk of cardiovascular disease to be 6%.  Therefore he does not require a statin.  Flu shot is up-to-date.  Shingles shot is up-to-date.  Recommended a booster on his Covid shot.  The remainder of his lab work is excellent other than his PSA.  Given the rise in his PSA, the nodularity I feel on exam, and his family history, I have recommended urology consultation for further evaluation.  Patient is comfortable with that plan.

## 2020-10-25 ENCOUNTER — Ambulatory Visit (INDEPENDENT_AMBULATORY_CARE_PROVIDER_SITE_OTHER): Payer: BC Managed Care – PPO | Admitting: Urology

## 2020-10-25 ENCOUNTER — Encounter: Payer: Self-pay | Admitting: Urology

## 2020-10-25 ENCOUNTER — Other Ambulatory Visit: Payer: Self-pay

## 2020-10-25 VITALS — BP 148/80 | HR 75 | Wt 188.0 lb

## 2020-10-25 DIAGNOSIS — R972 Elevated prostate specific antigen [PSA]: Secondary | ICD-10-CM

## 2020-10-25 DIAGNOSIS — K4090 Unilateral inguinal hernia, without obstruction or gangrene, not specified as recurrent: Secondary | ICD-10-CM

## 2020-10-25 DIAGNOSIS — N402 Nodular prostate without lower urinary tract symptoms: Secondary | ICD-10-CM

## 2020-10-25 DIAGNOSIS — Z8042 Family history of malignant neoplasm of prostate: Secondary | ICD-10-CM

## 2020-10-25 NOTE — Progress Notes (Signed)
10/25/2020 3:00 PM   Carlos [name] [birth year] [id number]  Referring provider: Susy Frizzle, MD [address] [facility] [place],  [place] [address]  Chief Complaint  Patient presents with  . Elevated PSA    HPI: 59 year old male referred for further evaluation of elevated PSA.  PSA trend as below.    He had a recent bump in his PSA as outlined below.  He does have family history of prostate cancer.  His father was diagnosed in his early 57s and underwent prostatectomy.  He required no further treatment is still living.  He was also noted to have an abnormal rectal exam by his primary care physician noting a nodular area of the right lobe near the 3 o'clock position.  He denies any significant urinary symptoms.  He does have some daytime frequency after lunch after he drinks a lot of fluids.  He empties his bladder well.  Good stream.  No weight loss or any other new symptoms.  He does mention today that he has had bulging in his right inguinal area that is had for several years.  He denies any pain with this.  Component     Latest Ref Rng & Units 08/17/2016 09/11/2017 09/19/2018 09/28/2019  PSA     < OR = 4.0 ng/mL 1.2 1.6 1.5 1.9   Component     Latest Ref Rng & Units 09/30/2020  PSA     < OR = 4.0 ng/mL 3.81     PMH: Past Medical History:  Diagnosis Date  . 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 TONSILLECTOMY      Home Medications:  Allergies as of 10/25/2020      Reactions   Bee Venom       Medication List       Accurate as of October 25, 2020  3:00 PM. If you have any questions, ask your nurse or doctor.        STOP taking these medications   diclofenac 75 MG EC tablet Commonly known as: VOLTAREN Stopped by: Hollice Espy, MD       Allergies:  Allergies  Allergen Reactions  . Bee Venom     Family History: Family History  Problem Relation Age of Onset  . Diabetes  Mother   . Vision loss Father   . Cancer Father 63       prostate  . Cancer Maternal Grandfather   . Colon cancer Neg Hx     Social History:  reports that he has never smoked. He has never used smokeless tobacco. He reports current alcohol use. He reports that he does not use drugs.   Physical Exam: BP (!) 148/80   Pulse 75   Wt 188 lb (85.3 kg)   BMI 25.50 kg/m   Constitutional:  Alert and oriented, No acute distress. HEENT: [place] AT, moist mucus membranes.  Trachea midline, no masses. Cardiovascular: No clubbing, cyanosis, or edema. Respiratory: Normal respiratory effort, no increased work of breathing. Rectal: Normal sphincter tone.  Small 30 cc prostate, no significant nodularity appreciated although there is some very subtle slight asymmetry, left greater than right Inguinal: Slight fullness in the right inguinal area with the subtle bulge with Valsalva. Skin: No rashes, bruises or suspicious lesions. Neurologic: Grossly intact, no focal deficits, moving all 4 extremities. Psychiatric: Normal mood and affect.  Laboratory Data: Lab Results  Component Value Date   WBC  5.8 09/30/2020   HGB 15.9 09/30/2020   HCT 46.5 09/30/2020   MCV 89.8 09/30/2020   PLT 248 09/30/2020    Lab Results  Component Value Date   CREATININE 1.04 09/30/2020    Lab Results  Component Value Date   PSA 3.81 09/30/2020   PSA 1.9 09/28/2019   PSA 1.5 09/19/2018    Assessment & Plan:    1. Rising PSA level  We reviewed the implications of an elevated PSA and the uncertainty surrounding it. In general, a man's PSA increases with age and is produced by both normal and cancerous prostate tissue. Differential for elevated PSA is BPH, prostate cancer, infection, recent intercourse/ejaculation, prostate infarction, recent urethroscopic manipulation (foley placement/cystoscopy) and prostatitis. Management of an elevated PSA can include observation or prostate biopsy and wediscussed this in detail. We  discussed that indications for prostate biopsy are defined by age and race specific PSA cutoffs as well as a PSA velocity of 0.75/year.  Significant rise of the last calendar year but PSA has not yet been repeated.  Recommend repeating this prior to pursuing any further work-up including prostate biopsy versus MRI.  Given his strong family history of prostate cancer, his PSA is truly elevated from his previous baseline, would recommend prostate biopsy.  We discussed prostate biopsy in detail including the procedure itself, the risks of blood in the urine, stool, and ejaculate, serious infection, and discomfort. He is willing to proceed with this as discussed if deemed necessary.   - PSA; Future - PSA  2. Family history of prostate cancer Risk factor as above  3. Prostate nodule He does have some very mild prostatic asymmetry but no discrete nodules  4. Right inguinal hernia Based on exam today, suspect fat-containing hernia which is relatively asymptomatic  Would not recommend treatment unless he is having symptoms or enlarges, offered general surgery referral but declined at this time   We will call with PSA results and recommendations  Hollice Espy, MD  [place] [address], [place], [place] [address] [phone]

## 2020-10-26 ENCOUNTER — Telehealth: Payer: Self-pay | Admitting: *Deleted

## 2020-10-26 LAB — PSA: Prostate Specific Ag, Serum: 2.5 ng/mL (ref 0.0–4.0)

## 2020-10-26 NOTE — Telephone Encounter (Signed)
error 

## 2020-11-14 ENCOUNTER — Telehealth: Payer: Self-pay | Admitting: Family Medicine

## 2020-11-14 NOTE — Telephone Encounter (Signed)
After Provider fills forms out, will give patient a call

## 2020-11-14 NOTE — Telephone Encounter (Signed)
Drop off 2022 [facility] [employer] Form. Forms in green folder

## 2021-03-07 ENCOUNTER — Other Ambulatory Visit: Payer: Self-pay | Admitting: Family Medicine

## 2021-03-07 DIAGNOSIS — R972 Elevated prostate specific antigen [PSA]: Secondary | ICD-10-CM

## 2021-04-28 ENCOUNTER — Other Ambulatory Visit: Payer: BC Managed Care – PPO

## 2021-04-28 ENCOUNTER — Other Ambulatory Visit: Payer: Self-pay

## 2021-04-28 DIAGNOSIS — R972 Elevated prostate specific antigen [PSA]: Secondary | ICD-10-CM

## 2021-04-29 LAB — PSA: Prostate Specific Ag, Serum: 3.8 ng/mL (ref 0.0–4.0)

## 2021-05-03 ENCOUNTER — Other Ambulatory Visit: Payer: Self-pay

## 2021-05-03 ENCOUNTER — Ambulatory Visit (INDEPENDENT_AMBULATORY_CARE_PROVIDER_SITE_OTHER): Payer: BC Managed Care – PPO | Admitting: Urology

## 2021-05-03 VITALS — BP 138/85 | HR 57 | Ht 72.0 in | Wt 187.0 lb

## 2021-05-03 DIAGNOSIS — Z8042 Family history of malignant neoplasm of prostate: Secondary | ICD-10-CM

## 2021-05-03 DIAGNOSIS — R972 Elevated prostate specific antigen [PSA]: Secondary | ICD-10-CM

## 2021-05-03 NOTE — Patient Instructions (Signed)

## 2021-05-03 NOTE — Progress Notes (Signed)
   05/03/2021 3:20 PM   Carlos Carlos [name] May 10, 1961 [id number]  Referring provider: Susy Frizzle, MD [address] [place],  [place] [address]  Chief Complaint  Patient presents with   Elevated PSA    HPI: 60 year old male with a personal history of elevated PSA who returns today for follow-up.  Notably, he has had a steadily rising PSA for several years.  At last visit, we rechecked his PSA to go down slightly but now is trending back upwards to 3.8.  He was also noted to have subtle slight asymmetric changes on prostate exam in December 28/21, left greater than right.  Please see previous notes for details.  He does have a strong family history of prostate cancer.    PMH: Past Medical History:  Diagnosis Date   HSV-1 infection    on Carlos [name]    Surgical History: Past Surgical History:  Procedure Laterality Date   TONSILLECTOMY      Home Medications:  Allergies as of 05/03/2021       Reactions   Bee Venom         Medication List    as of May 03, 2021  3:20 PM   You have not been prescribed any medications.     Allergies:  Allergies  Allergen Reactions   Bee Venom     Family History: Family History  Problem Relation Age of Onset   Diabetes Mother    Vision loss Father    Cancer Father 9       prostate   Cancer Maternal Grandfather    Colon cancer Neg Hx     Social History:  reports that he has never smoked. He has never used smokeless tobacco. He reports current alcohol use. He reports that he does not use drugs.   Physical Exam: BP 138/85   Pulse (!) 57   Ht 6' (1.829 m)   Wt 187 lb (84.8 kg)   BMI 25.36 kg/m   Constitutional:  Alert and oriented, No acute distress. HEENT: [place] AT, moist mucus membranes.  Trachea midline, no masses. Cardiovascular: No clubbing, cyanosis, or edema. Respiratory: Normal respiratory effort, no increased work of breathing. Skin: No rashes, bruises or suspicious lesions. Neurologic: Grossly  intact, no focal deficits, moving all 4 extremities. Psychiatric: Normal mood and affect.  Review of all previous PSA numbers today  Assessment & Plan:    1. Rising PSA level We discussed his overall upward trending PSA and consultation with slightly abnormal rectal exam and strong family history  At this point in time, we discussed that I would most strongly recommend either prostate biopsy or prostate MRI for further evaluation.  We discussed the risk and benefits of each.  He is willing to pursue prostate biopsy.  We discussed prostate biopsy in detail including the procedure itself, the risks of blood in the urine, stool, and ejaculate, serious infection, and discomfort. He is willing to proceed with this as discussed.   2. Family history of prostate cancer As above    Carlos Espy, MD  [facility] [address] [place], [place] [address] [phone]

## 2021-05-30 ENCOUNTER — Other Ambulatory Visit: Payer: BC Managed Care – PPO | Admitting: Urology

## 2021-05-30 ENCOUNTER — Telehealth: Payer: Self-pay | Admitting: Family Medicine

## 2021-05-30 DIAGNOSIS — Z20822 Contact with and (suspected) exposure to covid-19: Secondary | ICD-10-CM | POA: Diagnosis not present

## 2021-05-30 NOTE — Telephone Encounter (Signed)
Patient took Covid strip test yesterday afternoon and it was positive. Symptoms are headache, pressure on eyes, throat pain, joint pain, coughing; patient had fever of 101.4 yesterday. Patient seeking advice for managing symptoms. Please advise at [phone].

## 2021-06-06 ENCOUNTER — Ambulatory Visit: Payer: BC Managed Care – PPO | Admitting: Urology

## 2021-07-03 NOTE — Progress Notes (Signed)
    07/04/2021  CC:  Chief Complaint  Patient presents with   Prostate Biopsy    HPI: Carlos [name] is a 60 y.o. male who presents today for a prostate biopsy.  He has a personal history of elevated PSA that has been steadily rising for several years.   He notably had subtle slight asymmetric changes on prostate exam in 10/2020, left greater than right.   He has a strong family history of prostate cancer.    Blood pressure 124/82, pulse (!) 50, height 6' (1.829 m), weight 180 lb (81.6 kg). NED. A&Ox3.   No respiratory distress   Abd soft, NT, ND Normal phallus with bilateral descended testicles   Prostate Biopsy Procedure   Informed consent was obtained after discussing risks/benefits of the procedure.  A time out was performed to ensure correct patient identity.   Pre-Procedure: - Last PSA Level:  Component     Latest Ref Rng & Units 10/25/2020 04/28/2021  Prostate Specific Ag, Serum     0.0 - 4.0 ng/mL 2.5 3.8   - Gentamicin given prophylactically - Levaquin 500 mg administered PO -Transrectal Ultrasound performed revealing a 63.1 gm prostate -No significant hypoechoic or median lobe noted  Procedure: - Prostate block performed using 10 cc 1% lidocaine and biopsies taken from sextant areas, a total of 12 under ultrasound guidance.  Post-Procedure: - Patient tolerated the procedure well - He was counseled to seek immediate medical attention if experiences any severe pain, significant bleeding, or fevers - Return in one week to discuss biopsy results   I,Kailey Littlejohn,acting as a scribe for Hollice Espy, MD.,have documented all relevant documentation on the behalf of Hollice Espy, MD,as directed by  Hollice Espy, MD while in the presence of Hollice Espy, MD.  I have reviewed the above documentation for accuracy and completeness, and I agree with the above.   Hollice Espy, MD

## 2021-07-04 ENCOUNTER — Ambulatory Visit (INDEPENDENT_AMBULATORY_CARE_PROVIDER_SITE_OTHER): Payer: BC Managed Care – PPO | Admitting: Urology

## 2021-07-04 ENCOUNTER — Other Ambulatory Visit: Payer: Self-pay

## 2021-07-04 VITALS — BP 124/82 | HR 50 | Ht 72.0 in | Wt 180.0 lb

## 2021-07-04 DIAGNOSIS — Z9889 Other specified postprocedural states: Secondary | ICD-10-CM

## 2021-07-04 DIAGNOSIS — R972 Elevated prostate specific antigen [PSA]: Secondary | ICD-10-CM | POA: Diagnosis not present

## 2021-07-04 MED ORDER — GENTAMICIN SULFATE 40 MG/ML IJ SOLN
80.0000 mg | Freq: Once | INTRAMUSCULAR | Status: AC
  Administered 2021-07-04: 80 mg via INTRAMUSCULAR

## 2021-07-04 MED ORDER — LEVOFLOXACIN 500 MG PO TABS
500.0000 mg | ORAL_TABLET | Freq: Once | ORAL | Status: AC
  Administered 2021-07-04: 500 mg via ORAL

## 2021-07-07 LAB — SURGICAL PATHOLOGY

## 2021-07-17 NOTE — Progress Notes (Signed)
07/18/21 3:57 PM   Valentino Nose [birth year] [id number]  Referring provider:  Susy Frizzle, MD [address] [place],  [place] [address] Chief Complaint  Patient presents with   Results     HPI: Carlos [name] is a 60 y.o.male who returns today for discussion of prostate biopsy results.   He has a personal history of elevated PSA that has been steadily rising for several years, iPSA 3.8.   He notably had subtle slight asymmetric changes on prostate exam in 10/2020, left greater than right.    He has a strong family history of prostate cancer.   His surgical pathology on 07/04/2021 revealed  Gleason of 3+3 in 3/12 cores, low volume as below.    Surgical Pathology:    Diagnostic Summary   [A] PROSTATE, LEFT BASE:   NEGATIVE FOR MALIGNANCY.   [B] PROSTATE, LEFT MID:   NEGATIVE FOR MALIGNANCY.   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   [D] PROSTATE, RIGHT BASE:   ACINAR ADENOCARCINOMA, GLEASON 3+3=6  (GG  1), INVOLVING 1 CORES, MEASURING 1  MM ( 12%).   [E] PROSTATE, RIGHT MID:   NEGATIVE FOR MALIGNANCY.   [F] PROSTATE, RIGHT APEX:   SMALL FOCUS OF ATYPICAL GLANDS.   [G] PROSTATE, LEFT LATERAL BASE:   NEGATIVE FOR MALIGNANCY.   [H] PROSTATE, LEFT LATERAL MID:   NEGATIVE FOR MALIGNANCY.   [I] PROSTATE, LEFT LATERAL APEX:   NEGATIVE FOR MALIGNANCY.   [J] PROSTATE, RIGHT LATERAL BASE:   ACINAR ADENOCARCINOMA, GLEASON  3+3=6  (GG 1), INVOLVING 1 CORES, MEASURING 0.5  MM ( 4%).   [K] PROSTATE, RIGHT LATERAL MID:   NEGATIVE FOR MALIGNANCY.   [L] PROSTATE, RIGHT LATERAL APEX:   NEGATIVE FOR MALIGNANCY.    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     Allergies:  Allergies  Allergen Reactions   Bee Venom     Family History: Family History  Problem Relation Age of Onset   Diabetes Mother    Vision loss Father    Cancer Father 3       prostate   Cancer Maternal Grandfather    Colon cancer Neg Hx     Social History:  reports that he has never smoked. He has never used smokeless tobacco. He reports current alcohol use. He reports that he does not use drugs.   Physical Exam: BP (!) 144/79   Pulse (!) 58   Constitutional:  Alert and oriented, No acute distress. HEENT: [place] AT, moist mucus membranes.  Trachea midline, no masses. Cardiovascular: No clubbing, cyanosis, or edema. Respiratory: Normal respiratory effort, no increased work of breathing. Skin: No rashes, bruises or suspicious lesions. Neurologic: Grossly intact, no focal deficits, moving all 4 extremities. Psychiatric: Normal mood and affect.  Laboratory Data:  Lab Results  Component Value Date   CREATININE 1.04 09/30/2020    Lab Results  Component Value Date   PSA 3.81 09/30/2020  PSA 1.9 09/28/2019   PSA 1.5 09/19/2018      Assessment & Plan:   Prostate cancer, very low risk low volume diease - The patient was counseled about the natural history of prostate cancer and the standard treatment options that are available for prostate cancer. It was explained to him how his age and life expectancy, clinical stage, Gleason score, and PSA affect his prognosis, the decision to proceed with additional staging studies, as well as how that information influences recommended treatment strategies. We discussed the roles for active surveillance, radiation therapy, surgical therapy, androgen deprivation, as well as ablative therapy options for the treatment of prostate cancer as appropriate to his individual cancer situation. We discussed the risks and benefits of these options with regard  to their impact on cancer control and also in terms of potential adverse events, complications, and impact on quality of life particularly related to urinary, bowel, and sexual function. The patient was encouraged to ask questions throughout the discussion today and all questions were answered to his stated satisfaction. In addition, the patient was provided with and/or directed to appropriate resources and literature for further education about prostate cancer treatment options. - most strongly recommend active surveillance - surveillance biospy recommended to make sure there is no change of progression vs. MRI at 1 year interval or sooner as clinically indicated -PSA with PCP in 6 months (he will send me as message to notify me of results) -also discussed healthy lifestyle as it relates to prostate cancer  F/u 1 year PSA/ DRE/ possible biopsy vs. MRI     I,Kailey Littlejohn,acting as a scribe for Hollice Espy, MD.,have documented all relevant documentation on the behalf of Hollice Espy, MD,as directed by  Hollice Espy, MD while in the presence of Hollice Espy, MD.  I have reviewed the above documentation for accuracy and completeness, and I agree with the above.   Hollice Espy, MD  [facility] [address], [place], [place] [address] [phone]  I spent 30 total minutes on the day of the encounter including pre-visit review of the medical record, face-to-face time with the patient, and post visit ordering of labs/imaging/tests.

## 2021-07-18 ENCOUNTER — Other Ambulatory Visit: Payer: Self-pay

## 2021-07-18 ENCOUNTER — Encounter: Payer: Self-pay | Admitting: Urology

## 2021-07-18 ENCOUNTER — Ambulatory Visit (INDEPENDENT_AMBULATORY_CARE_PROVIDER_SITE_OTHER): Payer: BC Managed Care – PPO | Admitting: Urology

## 2021-07-18 VITALS — BP 144/79 | HR 58

## 2021-07-18 DIAGNOSIS — R972 Elevated prostate specific antigen [PSA]: Secondary | ICD-10-CM

## 2021-07-18 DIAGNOSIS — C61 Malignant neoplasm of prostate: Secondary | ICD-10-CM

## 2021-07-18 NOTE — Patient Instructions (Signed)
Prostate MRI Prep: ? ?1- No ejaculation 48 hours prior to exam ? ?2- No food or drink or caffeine 4 hours prior to exam ? ?3- Fleets enema needs to be done 4 hours prior to exam  ? ?4- Urinate just prior to exam  ?

## 2021-10-09 ENCOUNTER — Other Ambulatory Visit: Payer: Self-pay

## 2021-10-09 DIAGNOSIS — Z0000 Encounter for general adult medical examination without abnormal findings: Secondary | ICD-10-CM

## 2021-10-11 ENCOUNTER — Other Ambulatory Visit: Payer: Self-pay

## 2021-10-11 ENCOUNTER — Other Ambulatory Visit: Payer: BC Managed Care – PPO

## 2021-10-11 DIAGNOSIS — E789 Disorder of lipoprotein metabolism, unspecified: Secondary | ICD-10-CM | POA: Diagnosis not present

## 2021-10-11 DIAGNOSIS — Z0000 Encounter for general adult medical examination without abnormal findings: Secondary | ICD-10-CM

## 2021-10-12 LAB — CBC WITH DIFFERENTIAL/PLATELET
Absolute Monocytes: 572 cells/uL (ref 200–950)
Basophils Absolute: 38 cells/uL (ref 0–200)
Basophils Relative: 0.7 %
Eosinophils Absolute: 151 cells/uL (ref 15–500)
Eosinophils Relative: 2.8 %
HCT: 50.3 % — ABNORMAL HIGH (ref 38.5–50.0)
Hemoglobin: 17 g/dL (ref 13.2–17.1)
Lymphs Abs: 2214 cells/uL (ref 850–3900)
MCH: 30.2 pg (ref 27.0–33.0)
MCHC: 33.8 g/dL (ref 32.0–36.0)
MCV: 89.5 fL (ref 80.0–100.0)
MPV: 9.6 fL (ref 7.5–12.5)
Monocytes Relative: 10.6 %
Neutro Abs: 2425 cells/uL (ref 1500–7800)
Neutrophils Relative %: 44.9 %
Platelets: 260 10*3/uL (ref 140–400)
RBC: 5.62 10*6/uL (ref 4.20–5.80)
RDW: 12.8 % (ref 11.0–15.0)
Total Lymphocyte: 41 %
WBC: 5.4 10*3/uL (ref 3.8–10.8)

## 2021-10-12 LAB — LIPID PANEL
Cholesterol: 186 mg/dL (ref <200)
HDL: 58 mg/dL (ref >=40)
LDL Cholesterol (Calc): 110 mg/dL (calc) — ABNORMAL HIGH
Non-HDL Cholesterol (Calc): 128 mg/dL (calc) (ref <130)
Total CHOL/HDL Ratio: 3.2 (calc) (ref <5.0)
Triglycerides: 87 mg/dL (ref <150)

## 2021-10-12 LAB — COMPREHENSIVE METABOLIC PANEL
AG Ratio: 2.1 (calc) (ref 1.0–2.5)
ALT: 28 U/L (ref 9–46)
AST: 24 U/L (ref 10–35)
Albumin: 4.5 g/dL (ref 3.6–5.1)
Alkaline phosphatase (APISO): 48 U/L (ref 35–144)
BUN: 15 mg/dL (ref 7–25)
CO2: 31 mmol/L (ref 20–32)
Calcium: 9.3 mg/dL (ref 8.6–10.3)
Chloride: 101 mmol/L (ref 98–110)
Creat: 0.96 mg/dL (ref 0.70–1.30)
Globulin: 2.1 g/dL (calc) (ref 1.9–3.7)
Glucose, Bld: 96 mg/dL (ref 65–99)
Potassium: 4.4 mmol/L (ref 3.5–5.3)
Sodium: 139 mmol/L (ref 135–146)
Total Bilirubin: 0.5 mg/dL (ref 0.2–1.2)
Total Protein: 6.6 g/dL (ref 6.1–8.1)

## 2021-10-17 ENCOUNTER — Encounter: Payer: Self-pay | Admitting: Family Medicine

## 2021-10-17 ENCOUNTER — Ambulatory Visit (INDEPENDENT_AMBULATORY_CARE_PROVIDER_SITE_OTHER): Payer: BC Managed Care – PPO | Admitting: Family Medicine

## 2021-10-17 ENCOUNTER — Other Ambulatory Visit: Payer: Self-pay

## 2021-10-17 VITALS — BP 150/90 | HR 95 | Temp 97.2°F | Resp 18 | Ht 72.0 in | Wt 189.0 lb

## 2021-10-17 DIAGNOSIS — I10 Essential (primary) hypertension: Secondary | ICD-10-CM

## 2021-10-17 DIAGNOSIS — Z0000 Encounter for general adult medical examination without abnormal findings: Secondary | ICD-10-CM

## 2021-10-17 DIAGNOSIS — C61 Malignant neoplasm of prostate: Secondary | ICD-10-CM | POA: Diagnosis not present

## 2021-10-17 LAB — PSA: PSA: 2.42 ng/mL (ref <=4.00)

## 2021-10-17 MED ORDER — VALACYCLOVIR HCL 1 G PO TABS: 2000.0000 mg | ORAL_TABLET | Freq: Two times a day (BID) | ORAL | 11 refills | Status: AC

## 2021-10-17 MED ORDER — VALSARTAN 160 MG PO TABS: 160.0000 mg | ORAL_TABLET | Freq: Every day | ORAL | 3 refills | Status: DC

## 2021-10-17 NOTE — Progress Notes (Signed)
Subjective:    Patient ID: Carlos [name], male    DOB: 07-31-1961, 60 y.o.   MRN: [id number]  HPI Patient is here today for complete physical exam.  His last colonoscopy was in 2014 and is good until 2024.  Last year due to a rise in his PSA I consulted urology.  The patient had a prostate biopsy performed which showed Gleason 6 prostate cancer.  Urologist recommended checking a PSA every 6 months and clinical surveillance rather than definitive treatment at the present time.  He is due to repeat the PSA.  His last PSA was 3.8.  His most recent lab work is listed below.  He is due for a COVID booster.  His flu shot is up-to-date.  His shingles vaccine is up-to-date. Appointment on 10/11/2021  Component Date Value Ref Range Status   WBC 10/11/2021 5.4  3.8 - 10.8 Thousand/uL Final   RBC 10/11/2021 5.62  4.20 - 5.80 Million/uL Final   Hemoglobin 10/11/2021 17.0  13.2 - 17.1 g/dL Final   HCT 10/11/2021 50.3 (H)  38.5 - 50.0 % Final   MCV 10/11/2021 89.5  80.0 - 100.0 fL Final   MCH 10/11/2021 30.2  27.0 - 33.0 pg Final   MCHC 10/11/2021 33.8  32.0 - 36.0 g/dL Final   RDW 10/11/2021 12.8  11.0 - 15.0 % Final   Platelets 10/11/2021 260  140 - 400 Thousand/uL Final   MPV 10/11/2021 9.6  7.5 - 12.5 fL Final   Neutro Abs 10/11/2021 2,425  1,500 - 7,800 cells/uL Final   Lymphs Abs 10/11/2021 2,214  850 - 3,900 cells/uL Final   Absolute Monocytes 10/11/2021 572  200 - 950 cells/uL Final   Eosinophils Absolute 10/11/2021 151  15 - 500 cells/uL Final   Basophils Absolute 10/11/2021 38  0 - 200 cells/uL Final   Neutrophils Relative % 10/11/2021 44.9  % Final   Total Lymphocyte 10/11/2021 41.0  % Final   Monocytes Relative 10/11/2021 10.6  % Final   Eosinophils Relative 10/11/2021 2.8  % Final   Basophils Relative 10/11/2021 0.7  % Final   Glucose, Bld 10/11/2021 96  65 - 99 mg/dL Final   Comment: .            Fasting reference interval .    BUN 10/11/2021 15  7 - 25 mg/dL Final   Creat  10/11/2021 0.96  0.70 - 1.30 mg/dL Final   BUN/Creatinine Ratio 24/58/0998 NOT APPLICABLE  6 - 22 (calc) Final   Sodium 10/11/2021 139  135 - 146 mmol/L Final   Potassium 10/11/2021 4.4  3.5 - 5.3 mmol/L Final   Chloride 10/11/2021 101  98 - 110 mmol/L Final   CO2 10/11/2021 31  20 - 32 mmol/L Final   Calcium 10/11/2021 9.3  8.6 - 10.3 mg/dL Final   Total Protein 10/11/2021 6.6  6.1 - 8.1 g/dL Final   Albumin 10/11/2021 4.5  3.6 - 5.1 g/dL Final   Globulin 10/11/2021 2.1  1.9 - 3.7 g/dL (calc) Final   AG Ratio 10/11/2021 2.1  1.0 - 2.5 (calc) Final   Total Bilirubin 10/11/2021 0.5  0.2 - 1.2 mg/dL Final   Alkaline phosphatase (APISO) 10/11/2021 48  35 - 144 U/L Final   AST 10/11/2021 24  10 - 35 U/L Final   ALT 10/11/2021 28  9 - 46 U/L Final   Cholesterol 10/11/2021 186  <200 mg/dL Final   HDL 10/11/2021 58  > OR = 40 mg/dL Final  Triglycerides 10/11/2021 87  <150 mg/dL Final   LDL Cholesterol (Calc) 10/11/2021 110 (H)  mg/dL (calc) Final   Comment: Reference range: <100 . Desirable range <100 mg/dL for primary prevention;   <70 mg/dL for patients with CHD or diabetic patients  with > or = 2 CHD risk factors. Marland Kitchen LDL-C is now calculated using the Martin-Hopkins  calculation, which is a validated novel method providing  better accuracy than the Friedewald equation in the  estimation of LDL-C.  Cresenciano Genre et al. Annamaria Helling. 4562;563(89): 2061-2068  (http://education.[facility].com/faq/FAQ164)    Total CHOL/HDL Ratio 10/11/2021 3.2  <5.0 (calc) Final   Non-HDL Cholesterol (Calc) 10/11/2021 128  <130 mg/dL (calc) Final   Comment: For patients with diabetes plus 1 major ASCVD risk  factor, treating to a non-HDL-C goal of <100 mg/dL  (LDL-C of <70 mg/dL) is considered a therapeutic  option.    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     TURMERIC PO Take by mouth.     No current facility-administered medications on file prior to visit.   Allergies  Allergen Reactions   Bee Venom    Social History   Socioeconomic History   Marital status: Married    Spouse name: Not on file   Number of children: Not on file   Years of education: Not on file   Highest education level: Not on file  Occupational History   Not on file  Tobacco Use   Smoking status: Never   Smokeless tobacco: Never  Substance and Sexual Activity   Alcohol use: Yes    Comment: Occasional   Drug use: No   Sexual activity: Yes  Other Topics Concern   Not on file  Social History Narrative   Not on file   Social Determinants of Health   Financial Resource Strain: Not on file  Food Insecurity: Not on file  Transportation Needs: Not on file  Physical Activity: Not on file  Stress: Not on file  Social Connections: Not on file  Intimate Partner Violence: Not on file   Family History  Problem Relation Age of Onset   Diabetes Mother    Vision loss Father    Cancer Father 97       prostate   Cancer Maternal Grandfather    Colon cancer Neg Hx       Review of Systems  All other systems reviewed and are negative.     Objective:   Physical Exam Vitals reviewed.  Constitutional:      General: He is not in acute distress.    Appearance: Normal appearance. He is well-developed and normal weight. He is not diaphoretic.  HENT:     Head: Normocephalic and atraumatic.     Right Ear: Tympanic membrane, ear canal and external ear normal.     Left Ear: Tympanic membrane, ear canal and external ear normal.     Nose: Nose normal. No congestion or rhinorrhea.     Mouth/Throat:     Pharynx: No oropharyngeal exudate or posterior oropharyngeal erythema.  Eyes:     General: No scleral icterus.       Right eye: No discharge.         Left  eye: No discharge.     Conjunctiva/sclera: Conjunctivae normal.     Pupils: Pupils are equal, round, and reactive to light.  Neck:     Thyroid: No thyromegaly.     Vascular: No carotid bruit or JVD.     Trachea: No tracheal deviation.  Cardiovascular:     Rate and Rhythm: Normal rate and regular rhythm.     Pulses: Normal pulses.     Heart sounds: Normal heart sounds. No murmur heard.   No friction rub. No gallop.  Pulmonary:     Effort: Pulmonary effort is normal. No respiratory distress.     Breath sounds: Normal breath sounds. No stridor. No wheezing, rhonchi or rales.  Chest:     Chest wall: No tenderness.  Abdominal:     General: Bowel sounds are normal. There is no distension.     Palpations: Abdomen is soft. There is no mass.     Tenderness: There is no abdominal tenderness. There is no guarding or rebound.  Musculoskeletal:     Cervical back: Normal range of motion and neck supple. No rigidity or tenderness.  Lymphadenopathy:     Cervical: No cervical adenopathy.  Skin:    General: Skin is warm.     Coloration: Skin is not jaundiced or pale.     Findings: No bruising, erythema, lesion or rash.  Neurological:     General: No focal deficit present.     Mental Status: He is alert and oriented to person, place, and time. Mental status is at baseline.     Cranial Nerves: No cranial nerve deficit.     Sensory: No sensory deficit.     Motor: No weakness or abnormal muscle tone.     Coordination: Coordination normal.     Gait: Gait normal.     Deep Tendon Reflexes: Reflexes are normal and symmetric. Reflexes normal.  Psychiatric:        Behavior: Behavior normal.        Thought Content: Thought content normal.        Judgment: Judgment normal.       Assessment & Plan:  Prostate cancer ([place]) - Plan: PSA  Routine general medical examination at a health care facility  Benign essential HTN Patient's physical exam today is normal except for elevated blood pressure  which I confirmed.  Patient been getting similar readings at home with a systolic blood pressure around 341 and the diastolic blood pressure around 90.  Therefore we have elected to start valsartan 160 mg daily and recheck blood pressure in 2 weeks.  Colonoscopy is up-to-date.  Repeat PSA in I will send a copy of that to his urologist.  Right now we are performing clinical surveillance only.  Recommended a COVID booster.  Shingles vaccine is up-to-date.  The remainder of his lab work is acceptable

## 2022-07-13 ENCOUNTER — Other Ambulatory Visit: Payer: BC Managed Care – PPO

## 2022-07-18 ENCOUNTER — Ambulatory Visit: Payer: BC Managed Care – PPO | Admitting: Urology

## 2022-10-19 ENCOUNTER — Other Ambulatory Visit: Payer: Self-pay

## 2022-10-22 ENCOUNTER — Other Ambulatory Visit: Payer: BC Managed Care – PPO

## 2022-10-22 DIAGNOSIS — R0789 Other chest pain: Secondary | ICD-10-CM | POA: Diagnosis not present

## 2022-10-22 DIAGNOSIS — I10 Essential (primary) hypertension: Secondary | ICD-10-CM | POA: Diagnosis not present

## 2022-10-22 DIAGNOSIS — Z0000 Encounter for general adult medical examination without abnormal findings: Secondary | ICD-10-CM

## 2022-10-22 DIAGNOSIS — C61 Malignant neoplasm of prostate: Secondary | ICD-10-CM | POA: Diagnosis not present

## 2022-10-22 DIAGNOSIS — Z13220 Encounter for screening for lipoid disorders: Secondary | ICD-10-CM

## 2022-10-23 ENCOUNTER — Ambulatory Visit (INDEPENDENT_AMBULATORY_CARE_PROVIDER_SITE_OTHER): Payer: BC Managed Care – PPO | Admitting: Family Medicine

## 2022-10-23 VITALS — BP 124/68 | HR 58 | Ht 72.0 in | Wt 185.0 lb

## 2022-10-23 DIAGNOSIS — Z1211 Encounter for screening for malignant neoplasm of colon: Secondary | ICD-10-CM | POA: Diagnosis not present

## 2022-10-23 DIAGNOSIS — I10 Essential (primary) hypertension: Secondary | ICD-10-CM

## 2022-10-23 DIAGNOSIS — C61 Malignant neoplasm of prostate: Secondary | ICD-10-CM

## 2022-10-23 DIAGNOSIS — Z0000 Encounter for general adult medical examination without abnormal findings: Secondary | ICD-10-CM | POA: Diagnosis not present

## 2022-10-23 LAB — LIPID PANEL
Cholesterol: 173 mg/dL (ref <200)
HDL: 54 mg/dL (ref >=40)
LDL Cholesterol (Calc): 100 mg/dL (calc) — ABNORMAL HIGH
Non-HDL Cholesterol (Calc): 119 mg/dL (calc) (ref <130)
Total CHOL/HDL Ratio: 3.2 (calc) (ref <5.0)
Triglycerides: 96 mg/dL (ref <150)

## 2022-10-23 LAB — CBC WITH DIFFERENTIAL/PLATELET
Absolute Monocytes: 493 {cells}/uL (ref 200–950)
Basophils Absolute: 42 {cells}/uL (ref 0–200)
Basophils Relative: 0.8 %
Eosinophils Absolute: 180 {cells}/uL (ref 15–500)
Eosinophils Relative: 3.4 %
HCT: 45.3 % (ref 38.5–50.0)
Hemoglobin: 15.2 g/dL (ref 13.2–17.1)
Lymphs Abs: 2019 {cells}/uL (ref 850–3900)
MCH: 30 pg (ref 27.0–33.0)
MCHC: 33.6 g/dL (ref 32.0–36.0)
MCV: 89.3 fL (ref 80.0–100.0)
MPV: 9.5 fL (ref 7.5–12.5)
Monocytes Relative: 9.3 %
Neutro Abs: 2565 {cells}/uL (ref 1500–7800)
Neutrophils Relative %: 48.4 %
Platelets: 221 10*3/uL (ref 140–400)
RBC: 5.07 Million/uL (ref 4.20–5.80)
RDW: 12.8 % (ref 11.0–15.0)
Total Lymphocyte: 38.1 %
WBC: 5.3 10*3/uL (ref 3.8–10.8)

## 2022-10-23 LAB — COMPLETE METABOLIC PANEL WITHOUT GFR
AG Ratio: 1.8 (calc) (ref 1.0–2.5)
ALT: 33 U/L (ref 9–46)
AST: 30 U/L (ref 10–35)
Albumin: 4.1 g/dL (ref 3.6–5.1)
Alkaline phosphatase (APISO): 46 U/L (ref 35–144)
BUN: 18 mg/dL (ref 7–25)
CO2: 29 mmol/L (ref 20–32)
Calcium: 9 mg/dL (ref 8.6–10.3)
Chloride: 105 mmol/L (ref 98–110)
Creat: 0.94 mg/dL (ref 0.70–1.35)
Globulin: 2.3 g/dL (ref 1.9–3.7)
Glucose, Bld: 111 mg/dL — ABNORMAL HIGH (ref 65–99)
Potassium: 4.5 mmol/L (ref 3.5–5.3)
Sodium: 141 mmol/L (ref 135–146)
Total Bilirubin: 0.3 mg/dL (ref 0.2–1.2)
Total Protein: 6.4 g/dL (ref 6.1–8.1)
eGFR: 93 mL/min/{1.73_m2}

## 2022-10-23 LAB — PSA: PSA: 2.98 ng/mL

## 2022-10-23 NOTE — Progress Notes (Signed)
Subjective:    Patient ID: Carlos [name], male    DOB: 11-Apr-1961, 61 y.o.   MRN: [id number]  HPI Patient is here today for complete physical exam.  His last colonoscopy was in 2014 and is good until 2024.   He has Gleason 6 prostate cancer treated with clinical monitoring.  Patient recently returned from a trip to [place].  He admits that he has been indulging in carbohydrate rich foods and has not been exercising.  His blood sugar has risen dramatically.  However his PSA remains very stable.  He is due for a Tdap.  Flu shot is up-to-date.  He is also due for a COVID booster.  He had the shingles series 2 years ago Lab on 10/22/2022  Component Date Value Ref Range Status   WBC 10/22/2022 5.3  3.8 - 10.8 Thousand/uL Final   RBC 10/22/2022 5.07  4.20 - 5.80 Million/uL Final   Hemoglobin 10/22/2022 15.2  13.2 - 17.1 g/dL Final   HCT 10/22/2022 45.3  38.5 - 50.0 % Final   MCV 10/22/2022 89.3  80.0 - 100.0 fL Final   MCH 10/22/2022 30.0  27.0 - 33.0 pg Final   MCHC 10/22/2022 33.6  32.0 - 36.0 g/dL Final   RDW 10/22/2022 12.8  11.0 - 15.0 % Final   Platelets 10/22/2022 221  140 - 400 Thousand/uL Final   MPV 10/22/2022 9.5  7.5 - 12.5 fL Final   Neutro Abs 10/22/2022 2,565  1,500 - 7,800 cells/uL Final   Lymphs Abs 10/22/2022 2,019  850 - 3,900 cells/uL Final   Absolute Monocytes 10/22/2022 493  200 - 950 cells/uL Final   Eosinophils Absolute 10/22/2022 180  15 - 500 cells/uL Final   Basophils Absolute 10/22/2022 42  0 - 200 cells/uL Final   Neutrophils Relative % 10/22/2022 48.4  % Final   Total Lymphocyte 10/22/2022 38.1  % Final   Monocytes Relative 10/22/2022 9.3  % Final   Eosinophils Relative 10/22/2022 3.4  % Final   Basophils Relative 10/22/2022 0.8  % Final   Glucose, Bld 10/22/2022 111 (H)  65 - 99 mg/dL Final   Comment: .            Fasting reference interval . For someone without known diabetes, a glucose value between 100 and 125 mg/dL is consistent with prediabetes and  should be confirmed with a follow-up test. .    BUN 10/22/2022 18  7 - 25 mg/dL Final   Creat 10/22/2022 0.94  0.70 - 1.35 mg/dL Final   eGFR 10/22/2022 93  > OR = 60 mL/min/1.74m Final   BUN/Creatinine Ratio 10/22/2022 SEE NOTE:  6 - 22 (calc) Final   Comment:    Not Reported: BUN and Creatinine are within    reference range. .    Sodium 10/22/2022 141  135 - 146 mmol/L Final   Potassium 10/22/2022 4.5  3.5 - 5.3 mmol/L Final   Chloride 10/22/2022 105  98 - 110 mmol/L Final   CO2 10/22/2022 29  20 - 32 mmol/L Final   Calcium 10/22/2022 9.0  8.6 - 10.3 mg/dL Final   Total Protein 10/22/2022 6.4  6.1 - 8.1 g/dL Final   Albumin 10/22/2022 4.1  3.6 - 5.1 g/dL Final   Globulin 10/22/2022 2.3  1.9 - 3.7 g/dL (calc) Final   AG Ratio 10/22/2022 1.8  1.0 - 2.5 (calc) Final   Total Bilirubin 10/22/2022 0.3  0.2 - 1.2 mg/dL Final   Alkaline phosphatase (APISO) 10/22/2022 46  35 - 144 U/L Final   AST 10/22/2022 30  10 - 35 U/L Final   ALT 10/22/2022 33  9 - 46 U/L Final   Cholesterol 10/22/2022 173  <200 mg/dL Final   HDL 10/22/2022 54  > OR = 40 mg/dL Final   Triglycerides 10/22/2022 96  <150 mg/dL Final   LDL Cholesterol (Calc) 10/22/2022 100 (H)  mg/dL (calc) Final   Comment: Reference range: <100 . Desirable range <100 mg/dL for primary prevention;   <70 mg/dL for patients with CHD or diabetic patients  with > or = 2 CHD risk factors. Marland Kitchen LDL-C is now calculated using the Martin-Hopkins  calculation, which is a validated novel method providing  better accuracy than the Friedewald equation in the  estimation of LDL-C.  Cresenciano Genre et al. Annamaria Helling. 7793;903(00): 2061-2068  (http://education.[facility].com/faq/FAQ164)    Total CHOL/HDL Ratio 10/22/2022 3.2  <5.0 (calc) Final   Non-HDL Cholesterol (Calc) 10/22/2022 119  <130 mg/dL (calc) Final   Comment: For patients with diabetes plus 1 major ASCVD risk  factor, treating to a non-HDL-C goal of <100 mg/dL  (LDL-C of <70 mg/dL) is  considered a therapeutic  option.    PSA 10/22/2022 2.98  < OR = 4.00 ng/mL Final   Comment: The total PSA value from this assay system is  standardized against the WHO standard. The test  result will be approximately 20% lower when compared  to the equimolar-standardized total PSA (Beckman  Coulter). Comparison of serial PSA results should be  interpreted with this fact in mind. . This test was performed using the Siemens  chemiluminescent method. Values obtained from  different assay methods cannot be used interchangeably. PSA levels, regardless of value, should not be interpreted as absolute evidence of the presence or absence of disease.    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     TURMERIC PO Take by mouth.     valsartan (DIOVAN) 160 MG tablet Take 1 tablet (160 mg total) by mouth daily. 90 tablet 3   No current facility-administered medications on file prior to visit.   Allergies  Allergen Reactions   Bee Venom    Social History   Socioeconomic History   Marital status: Married    Spouse name: Not on file   Number of children: Not on file   Years of education: Not on file   Highest education level: Not on file  Occupational History   Not on file  Tobacco Use   Smoking status: Never   Smokeless tobacco: Never  Substance and Sexual Activity   Alcohol use: Yes    Comment: Occasional   Drug use: No   Sexual activity: Yes  Other Topics Concern   Not on file  Social History Narrative   Not on file   Social Determinants of Health   Financial Resource Strain: Not on file  Food Insecurity: Not on file  Transportation Needs: Not on file  Physical Activity: Not on file  Stress: Not on file  Social  Connections: Not on file  Intimate Partner Violence: Not on file   Family History  Problem Relation Age of Onset   Diabetes Mother    Vision loss Father  Cancer Father 31       prostate   Cancer Maternal Grandfather    Colon cancer Neg Hx       Review of Systems  All other systems reviewed and are negative.      Objective:   Physical Exam Vitals reviewed.  Constitutional:      General: He is not in acute distress.    Appearance: Normal appearance. He is well-developed and normal weight. He is not diaphoretic.  HENT:     Head: Normocephalic and atraumatic.     Right Ear: Tympanic membrane, ear canal and external ear normal.     Left Ear: Tympanic membrane, ear canal and external ear normal.     Nose: Nose normal. No congestion or rhinorrhea.     Mouth/Throat:     Pharynx: No oropharyngeal exudate or posterior oropharyngeal erythema.  Eyes:     General: No scleral icterus.       Right eye: No discharge.        Left eye: No discharge.     Conjunctiva/sclera: Conjunctivae normal.     Pupils: Pupils are equal, round, and reactive to light.  Neck:     Thyroid: No thyromegaly.     Vascular: No carotid bruit or JVD.     Trachea: No tracheal deviation.  Cardiovascular:     Rate and Rhythm: Normal rate and regular rhythm.     Pulses: Normal pulses.     Heart sounds: Normal heart sounds. No murmur heard.    No friction rub. No gallop.  Pulmonary:     Effort: Pulmonary effort is normal. No respiratory distress.     Breath sounds: Normal breath sounds. No stridor. No wheezing, rhonchi or rales.  Chest:     Chest wall: No tenderness.  Abdominal:     General: Bowel sounds are normal. There is no distension.     Palpations: Abdomen is soft. There is no mass.     Tenderness: There is no abdominal tenderness. There is no guarding or rebound.  Musculoskeletal:     Cervical back: Normal range of motion and neck supple. No rigidity or tenderness.  Lymphadenopathy:      Cervical: No cervical adenopathy.  Skin:    General: Skin is warm.     Coloration: Skin is not jaundiced or pale.     Findings: No bruising, erythema, lesion or rash.  Neurological:     General: No focal deficit present.     Mental Status: He is alert and oriented to person, place, and time. Mental status is at baseline.     Cranial Nerves: No cranial nerve deficit.     Sensory: No sensory deficit.     Motor: No weakness or abnormal muscle tone.     Coordination: Coordination normal.     Gait: Gait normal.     Deep Tendon Reflexes: Reflexes are normal and symmetric. Reflexes normal.  Psychiatric:        Behavior: Behavior normal.        Thought Content: Thought content normal.        Judgment: Judgment normal.        Assessment & Plan:  Colon cancer screening - Plan: Ambulatory referral to Gastroenterology  Routine general medical examination at a health care facility  Benign essential HTN  Prostate cancer [facility]) Patient's physical exam today is normal.  He received the tetanus shot.  I will place a referral for a colonoscopy.  I recommended that we continue to monitor  his PSA every 6 months.  I would also like him to institute a low carbohydrate diet and then recheck his blood sugar in 6 months.  Patient states that he can do that.  He is also going to start exercising by cycling.  His blood pressure is excellent at 124/68.  I recommended a COVID booster.  His flu shot is up-to-date.

## 2022-10-31 ENCOUNTER — Other Ambulatory Visit: Payer: Self-pay | Admitting: Family Medicine

## 2022-10-31 NOTE — Telephone Encounter (Signed)
PT NEED CPE APPT W/PCP FOR FUTURE REFILLS  

## 2022-11-09 NOTE — Telephone Encounter (Signed)
Patient just had a CPE on 10/23/22.

## 2022-11-15 ENCOUNTER — Other Ambulatory Visit: Payer: Self-pay | Admitting: Family Medicine

## 2022-11-15 MED ORDER — VALSARTAN 160 MG PO TABS: 160.0000 mg | ORAL_TABLET | Freq: Every day | ORAL | 3 refills | Status: DC

## 2023-10-02 ENCOUNTER — Encounter: Payer: Self-pay | Admitting: Internal Medicine

## 2023-10-09 ENCOUNTER — Encounter: Payer: Self-pay | Admitting: Internal Medicine

## 2023-10-21 ENCOUNTER — Other Ambulatory Visit: Payer: BC Managed Care – PPO

## 2023-10-21 DIAGNOSIS — I10 Essential (primary) hypertension: Secondary | ICD-10-CM | POA: Diagnosis not present

## 2023-10-21 DIAGNOSIS — C61 Malignant neoplasm of prostate: Secondary | ICD-10-CM | POA: Diagnosis not present

## 2023-10-21 DIAGNOSIS — Z13220 Encounter for screening for lipoid disorders: Secondary | ICD-10-CM | POA: Diagnosis not present

## 2023-10-21 DIAGNOSIS — R0789 Other chest pain: Secondary | ICD-10-CM

## 2023-10-21 DIAGNOSIS — R739 Hyperglycemia, unspecified: Secondary | ICD-10-CM | POA: Diagnosis not present

## 2023-10-25 ENCOUNTER — Ambulatory Visit (INDEPENDENT_AMBULATORY_CARE_PROVIDER_SITE_OTHER): Payer: BC Managed Care – PPO | Admitting: Family Medicine

## 2023-10-25 ENCOUNTER — Encounter: Payer: Self-pay | Admitting: Family Medicine

## 2023-10-25 VITALS — BP 136/68 | HR 63 | Temp 97.8°F | Ht 72.0 in | Wt 177.0 lb

## 2023-10-25 DIAGNOSIS — Z0001 Encounter for general adult medical examination with abnormal findings: Secondary | ICD-10-CM

## 2023-10-25 DIAGNOSIS — R739 Hyperglycemia, unspecified: Secondary | ICD-10-CM

## 2023-10-25 DIAGNOSIS — Z1211 Encounter for screening for malignant neoplasm of colon: Secondary | ICD-10-CM

## 2023-10-25 DIAGNOSIS — Z0000 Encounter for general adult medical examination without abnormal findings: Secondary | ICD-10-CM

## 2023-10-25 MED ORDER — MELOXICAM 15 MG PO TABS: 15.0000 mg | ORAL_TABLET | Freq: Every day | ORAL | 3 refills | Status: AC

## 2023-10-25 NOTE — Progress Notes (Signed)
Subjective:    Patient ID: Carlos [name], male    DOB: [birth year], 61 y.o.   MRN: [id number]  HPI Patient is here today for complete physical exam.  His last colonoscopy was in 2014.  He is due again this year.  He is already called and they have scheduled him for January.  His PSA has been slowly rising.  He is growing at a very slow pace.  He denies any symptoms of BPH.  In 2022 it was 2.4.  In 2023 was 3.0.  Today is essentially 3.2.  I do not believe that rate of growth is concerning.  The patient is due for a flu shot and a COVID shot.  He declines these.  He believes he has had his tetanus shot at an urgent care.  He is certain that he had the shingles vaccine at another location.  His most recent lab work is listed below Lab on 10/21/2023  Component Date Value Ref Range Status   WBC 10/21/2023 5.7  3.8 - 10.8 Thousand/uL Final   RBC 10/21/2023 5.07  4.20 - 5.80 Million/uL Final   Hemoglobin 10/21/2023 14.9  13.2 - 17.1 g/dL Final   HCT 57/84/6962 45.0  38.5 - 50.0 % Final   MCV 10/21/2023 88.8  80.0 - 100.0 fL Final   MCH 10/21/2023 29.4  27.0 - 33.0 pg Final   MCHC 10/21/2023 33.1  32.0 - 36.0 g/dL Final   Comment: For adults, a slight decrease in the calculated MCHC value (in the range of 30 to 32 g/dL) is most likely not clinically significant; however, it should be interpreted with caution in correlation with other red cell parameters and the patient's clinical condition.    RDW 10/21/2023 12.7  11.0 - 15.0 % Final   Platelets 10/21/2023 241  140 - 400 Thousand/uL Final   MPV 10/21/2023 9.7  7.5 - 12.5 fL Final   Neutro Abs 10/21/2023 2,320  1,500 - 7,800 cells/uL Final   Absolute Lymphocytes 10/21/2023 2,462  850 - 3,900 cells/uL Final   Absolute Monocytes 10/21/2023 638  200 - 950 cells/uL Final   Eosinophils Absolute 10/21/2023 239  15 - 500 cells/uL Final   Basophils Absolute 10/21/2023 40  0 - 200 cells/uL Final   Neutrophils Relative % 10/21/2023 40.7  % Final    Total Lymphocyte 10/21/2023 43.2  % Final   Monocytes Relative 10/21/2023 11.2  % Final   Eosinophils Relative 10/21/2023 4.2  % Final   Basophils Relative 10/21/2023 0.7  % Final   Glucose, Bld 10/21/2023 113 (H)  65 - 99 mg/dL Final   Comment: .            Fasting reference interval . For someone without known diabetes, a glucose value between 100 and 125 mg/dL is consistent with prediabetes and should be confirmed with a follow-up test. .    BUN 10/21/2023 17  7 - 25 mg/dL Final   Creat 95/28/4132 0.93  0.70 - 1.35 mg/dL Final   eGFR 44/11/270 93  > OR = 60 mL/min/1.13m2 Final   BUN/Creatinine Ratio 10/21/2023 SEE NOTE:  6 - 22 (calc) Final   Comment:    Not Reported: BUN and Creatinine are within    reference range. .    Sodium 10/21/2023 140  135 - 146 mmol/L Final   Potassium 10/21/2023 4.6  3.5 - 5.3 mmol/L Final   Chloride 10/21/2023 105  98 - 110 mmol/L Final   CO2 10/21/2023 27  20 -  32 mmol/L Final   Calcium 10/21/2023 9.1  8.6 - 10.3 mg/dL Final   Total Protein 13/06/6577 6.5  6.1 - 8.1 g/dL Final   Albumin 46/96/2952 4.2  3.6 - 5.1 g/dL Final   Globulin 84/13/2440 2.3  1.9 - 3.7 g/dL (calc) Final   AG Ratio 10/21/2023 1.8  1.0 - 2.5 (calc) Final   Total Bilirubin 10/21/2023 0.4  0.2 - 1.2 mg/dL Final   Alkaline phosphatase (APISO) 10/21/2023 40  35 - 144 U/L Final   AST 10/21/2023 20  10 - 35 U/L Final   ALT 10/21/2023 21  9 - 46 U/L Final   Cholesterol 10/21/2023 194  <200 mg/dL Final   HDL 09/15/2535 55  > OR = 40 mg/dL Final   Triglycerides 64/40/3474 105  <150 mg/dL Final   LDL Cholesterol (Calc) 10/21/2023 118 (H)  mg/dL (calc) Final   Comment: Reference range: <100 . Desirable range <100 mg/dL for primary prevention;   <70 mg/dL for patients with CHD or diabetic patients  with > or = 2 CHD risk factors. Marland Kitchen LDL-C is now calculated using the Martin-Hopkins  calculation, which is a validated novel method providing  better accuracy than the Friedewald  equation in the  estimation of LDL-C.  Horald Pollen et al. Lenox Ahr. 2595;638(75): 2061-2068  (http://education.[facility].com/faq/FAQ164)    Total CHOL/HDL Ratio 10/21/2023 3.5  <6.4 (calc) Final   Non-HDL Cholesterol (Calc) 10/21/2023 139 (H)  <130 mg/dL (calc) Final   Comment: For patients with diabetes plus 1 major ASCVD risk  factor, treating to a non-HDL-C goal of <100 mg/dL  (LDL-C of <33 mg/dL) is considered a therapeutic  option.    PSA 10/21/2023 3.19  < OR = 4.00 ng/mL Final   Comment: The total PSA value from this assay system is  standardized against the WHO standard. The test  result will be approximately 20% lower when compared  to the equimolar-standardized total PSA (Beckman  Coulter). Comparison of serial PSA results should be  interpreted with this fact in mind. . This test was performed using the Siemens  chemiluminescent method. Values obtained from  different assay methods cannot be used interchangeably. PSA levels, regardless of value, should not be interpreted as absolute evidence of the presence or absence of disease.    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     TURMERIC PO Take by mouth.     valsartan (DIOVAN) 160 MG tablet Take 1 tablet (160 mg total) by mouth daily. 90 tablet 3   No current facility-administered medications on file prior to visit.   Allergies  Allergen Reactions   Bee Venom    Social History   Socioeconomic History   Marital status: Married    Spouse name: Not on file   Number of children: Not on file   Years of education: Not on file   Highest education level: Associate degree: occupational, Scientist, product/process development, or vocational program  Occupational History   Not on file   Tobacco Use   Smoking status: Never   Smokeless tobacco: Never  Substance and Sexual Activity   Alcohol use: Yes    Comment: Occasional   Drug use: No   Sexual activity: Yes  Other Topics Concern  Not on file  Social History Narrative   Not on file   Social Determinants of Health   Financial Resource Strain: Low Risk  (10/24/2023)   Overall Financial Resource Strain (CARDIA)    Difficulty of Paying Living Expenses: Not very hard  Food Insecurity: No Food Insecurity (10/24/2023)   Hunger Vital Sign    Worried About Running Out of Food in the Last Year: Never true    Ran Out of Food in the Last Year: Never true  Transportation Needs: No Transportation Needs (10/24/2023)   PRAPARE - Administrator, Civil Service (Medical): No    Lack of Transportation (Non-Medical): No  Physical Activity: Sufficiently Active (10/24/2023)   Exercise Vital Sign    Days of Exercise per Week: 7 days    Minutes of Exercise per Session: 30 min  Stress: No Stress Concern Present (10/24/2023)   [employer] of Occupational Health - Occupational Stress Questionnaire    Feeling of Stress : Only a little  Social Connections: Moderately Isolated (10/24/2023)   Social Connection and Isolation Panel [NHANES]    Frequency of Communication with Friends and Family: Once a week    Frequency of Social Gatherings with Friends and Family: Once a week    Attends Religious Services: Never    Database administrator or Organizations: Yes    Attends Engineer, structural: More than 4 times per year    Marital Status: Married  Catering manager Violence: Not on file   Family History  Problem Relation Age of Onset   Diabetes Mother    Vision loss Father    Cancer Father 73       prostate   Cancer Maternal Grandfather    Colon cancer Neg Hx       Review of Systems  All other systems reviewed and are negative.      Objective:   Physical Exam Vitals reviewed.  Constitutional:       General: He is not in acute distress.    Appearance: Normal appearance. He is well-developed and normal weight. He is not diaphoretic.  HENT:     Head: Normocephalic and atraumatic.     Right Ear: Tympanic membrane, ear canal and external ear normal.     Left Ear: Tympanic membrane, ear canal and external ear normal.     Nose: Nose normal. No congestion or rhinorrhea.     Mouth/Throat:     Pharynx: No oropharyngeal exudate or posterior oropharyngeal erythema.  Eyes:     General: No scleral icterus.       Right eye: No discharge.        Left eye: No discharge.     Conjunctiva/sclera: Conjunctivae normal.     Pupils: Pupils are equal, round, and reactive to light.  Neck:     Thyroid: No thyromegaly.     Vascular: No carotid bruit or JVD.     Trachea: No tracheal deviation.  Cardiovascular:     Rate and Rhythm: Normal rate and regular rhythm.     Pulses: Normal pulses.     Heart sounds: Normal heart sounds. No murmur heard.    No friction rub. No gallop.  Pulmonary:     Effort: Pulmonary effort is normal. No respiratory distress.     Breath sounds: Normal breath sounds. No stridor. No wheezing, rhonchi or rales.  Chest:     Chest wall: No tenderness.  Abdominal:     General: Bowel sounds are normal. There is  no distension.     Palpations: Abdomen is soft. There is no mass.     Tenderness: There is no abdominal tenderness. There is no guarding or rebound.  Musculoskeletal:     Cervical back: Normal range of motion and neck supple. No rigidity or tenderness.  Lymphadenopathy:     Cervical: No cervical adenopathy.  Skin:    General: Skin is warm.     Coloration: Skin is not jaundiced or pale.     Findings: No bruising, erythema, lesion or rash.  Neurological:     General: No focal deficit present.     Mental Status: He is alert and oriented to person, place, and time. Mental status is at baseline.     Cranial Nerves: No cranial nerve deficit.     Sensory: No sensory deficit.      Motor: No weakness or abnormal muscle tone.     Coordination: Coordination normal.     Gait: Gait normal.     Deep Tendon Reflexes: Reflexes are normal and symmetric. Reflexes normal.  Psychiatric:        Behavior: Behavior normal.        Thought Content: Thought content normal.        Judgment: Judgment normal.        Assessment & Plan:  Elevated blood sugar - Plan: Hemoglobin A1c  Routine general medical examination at a health care facility  Colon cancer screening I recommended repeating his PSA in 6 months just to keep a closer eye on this.  Given his elevated sugar I will check a hemoglobin A1c.  Recommended a low-carb diet.  Colonoscopy has been scheduled for January.  Cholesterol is mildly elevated.  He denies any family history of heart disease.  We discussed a coronary artery calcium score to risk stratify further but he declines this at the present time.  Blood pressure is well-controlled.

## 2023-10-29 LAB — CBC WITH DIFFERENTIAL/PLATELET
Absolute Lymphocytes: 2462 {cells}/uL (ref 850–3900)
Absolute Monocytes: 638 {cells}/uL (ref 200–950)
Basophils Absolute: 40 {cells}/uL (ref 0–200)
Basophils Relative: 0.7 %
Eosinophils Absolute: 239 {cells}/uL (ref 15–500)
Eosinophils Relative: 4.2 %
HCT: 45 % (ref 38.5–50.0)
Hemoglobin: 14.9 g/dL (ref 13.2–17.1)
MCH: 29.4 pg (ref 27.0–33.0)
MCHC: 33.1 g/dL (ref 32.0–36.0)
MCV: 88.8 fL (ref 80.0–100.0)
MPV: 9.7 fL (ref 7.5–12.5)
Monocytes Relative: 11.2 %
Neutro Abs: 2320 {cells}/uL (ref 1500–7800)
Neutrophils Relative %: 40.7 %
Platelets: 241 10*3/uL (ref 140–400)
RBC: 5.07 10*6/uL (ref 4.20–5.80)
RDW: 12.7 % (ref 11.0–15.0)
Total Lymphocyte: 43.2 %
WBC: 5.7 10*3/uL (ref 3.8–10.8)

## 2023-10-29 LAB — COMPLETE METABOLIC PANEL WITH GFR
AG Ratio: 1.8 (calc) (ref 1.0–2.5)
ALT: 21 U/L (ref 9–46)
AST: 20 U/L (ref 10–35)
Albumin: 4.2 g/dL (ref 3.6–5.1)
Alkaline phosphatase (APISO): 40 U/L (ref 35–144)
BUN: 17 mg/dL (ref 7–25)
CO2: 27 mmol/L (ref 20–32)
Calcium: 9.1 mg/dL (ref 8.6–10.3)
Chloride: 105 mmol/L (ref 98–110)
Creat: 0.93 mg/dL (ref 0.70–1.35)
Globulin: 2.3 g/dL (ref 1.9–3.7)
Glucose, Bld: 113 mg/dL — ABNORMAL HIGH (ref 65–99)
Potassium: 4.6 mmol/L (ref 3.5–5.3)
Sodium: 140 mmol/L (ref 135–146)
Total Bilirubin: 0.4 mg/dL (ref 0.2–1.2)
Total Protein: 6.5 g/dL (ref 6.1–8.1)
eGFR: 93 mL/min/{1.73_m2} (ref >=60)

## 2023-10-29 LAB — LIPID PANEL
Cholesterol: 194 mg/dL (ref <200)
HDL: 55 mg/dL (ref >=40)
LDL Cholesterol (Calc): 118 mg/dL — ABNORMAL HIGH
Non-HDL Cholesterol (Calc): 139 mg/dL — ABNORMAL HIGH (ref <130)
Total CHOL/HDL Ratio: 3.5 (calc) (ref <5.0)
Triglycerides: 105 mg/dL (ref <150)

## 2023-10-29 LAB — TEST AUTHORIZATION

## 2023-10-29 LAB — HEMOGLOBIN A1C
Hgb A1c MFr Bld: 5.9 %{Hb} — ABNORMAL HIGH (ref <5.7)
Mean Plasma Glucose: 123 mg/dL
eAG (mmol/L): 6.8 mmol/L

## 2023-10-29 LAB — PSA: PSA: 3.19 ng/mL (ref <=4.00)

## 2023-11-07 ENCOUNTER — Ambulatory Visit: Payer: BC Managed Care – PPO

## 2023-11-07 VITALS — Ht 72.0 in | Wt 180.0 lb

## 2023-11-07 DIAGNOSIS — Z1211 Encounter for screening for malignant neoplasm of colon: Secondary | ICD-10-CM

## 2023-11-07 MED ORDER — NA SULFATE-K SULFATE-MG SULF 17.5-3.13-1.6 GM/177ML PO SOLN: 1.0000 | Freq: Once | ORAL | 0 refills | Status: AC

## 2023-11-07 NOTE — Progress Notes (Signed)
Pt's name and DOB verified at the beginning of the pre-visit wit 2 identifiers  Pt denies any difficulty with ambulating,sitting, laying down or rolling side to side  Pt has no issues with ambulation   Pt has no issues moving head neck or swallowing  No egg or soy allergy known to patient   Patient denies ever being intubated  No FH of Malignant Hyperthermia  Pt is not on diet pills or shots  Pt is not on home 02   Pt is not on blood thinners   Pt denies issues with constipation   Pt is not on dialysis  Pt denise any abnormal heart rhythms   Pt denies any upcoming cardiac testing  Pt encouraged to use to use Singlecare or Goodrx to reduce cost   Patient's chart reviewed by Cathlyn Parsons CNRA prior to pre-visit and patient appropriate for the [facility].  Pre-visit completed and red dot placed by patient's name on their procedure day (on provider's schedule).  .  Visit by phone  Pt states weight is 180 lb  Instructed pt why it is important to and  to call if they have any changes in health or new medications. Directed them to the # given and on instructions.     Instructions reviewed. Pt given both [facility] main # and MD on call # prior to instructions.  Pt states understanding. Instructed to review again prior to procedure. Pt states they will.   Instructions sent by mail with coupon and by My Chart  Coupon sent via text to mobile phone and pt verified they received it

## 2023-11-28 ENCOUNTER — Encounter: Payer: Self-pay | Admitting: Internal Medicine

## 2023-12-02 NOTE — Progress Notes (Signed)
 [facility] History and Physical   Primary Care Physician:  Duanne Butler DASEN, MD   Reason for Procedure:  Colon cancer screening  Plan:    Colonoscopy     HPI: Carlos [name] is a 63 y.o. male status post normal colonoscopy for screening in 2014 who presents for a repeat screening colonoscopy.   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 Authorizing Provider  meloxicam  (MOBIC ) 15 MG tablet Take 1 tablet (15 mg total) by mouth daily. 10/25/23   Duanne Butler DASEN, MD  Multiple Vitamin (MULTIVITAMIN) tablet Take 1 tablet by mouth daily.    [provider]  TURMERIC PO Take by mouth.    [provider]  valsartan  (DIOVAN ) 160 MG tablet Take 1 tablet (160 mg total) by mouth daily. 11/15/22   Duanne Butler DASEN, MD    Current Outpatient Medications  Medication Sig Dispense Refill   meloxicam  (MOBIC ) 15 MG tablet Take 1 tablet (15 mg total) by mouth daily. 30 tablet 3   Multiple Vitamin (MULTIVITAMIN) tablet Take 1 tablet by mouth daily.     TURMERIC PO Take by mouth.     valsartan  (DIOVAN ) 160 MG tablet Take 1 tablet (160 mg total) by mouth daily. 90 tablet 3   Current Facility-Administered Medications  Medication Dose Route Frequency Provider Last Rate Last Admin   0.9 %  sodium chloride  infusion  500 mL Intravenous Once Avram Lupita BRAVO, MD        Allergies as of 12/03/2023 - Review Complete 12/03/2023  Allergen Reaction Noted   Bee venom  07/22/2014    Family History  Problem Relation Age of Onset   Diabetes Mother    Vision loss Father    Cancer Father 25       prostate   Cancer Maternal Grandfather    Colon cancer Neg Hx    Colon polyps Neg Hx    Esophageal cancer Neg Hx    Rectal cancer Neg Hx    Stomach  cancer Neg Hx     Social History   Socioeconomic History   Marital status: Married    Spouse name: Not on file   Number of children: Not on file   Years of education: Not on file   Highest education level: Associate degree: occupational, scientist, product/process development, or vocational program  Occupational History   Not on file  Tobacco Use   Smoking status: Never   Smokeless tobacco: Never  Substance and Sexual Activity   Alcohol use: Yes    Comment: Occasional   Drug use: No   Sexual activity: Yes  Other Topics Concern   Not on file  Social History Narrative   Not on file   Social Drivers of Health   Financial Resource Strain: Low Risk  (10/24/2023)   Overall Financial Resource Strain (CARDIA)    Difficulty of Paying Living Expenses: Not very hard  Food Insecurity: No Food Insecurity (10/24/2023)   Hunger Vital Sign    Worried About Running Out of Food in the Last Year: Never true    Ran Out of Food in the Last Year: Never true  Transportation Needs: No Transportation Needs (10/24/2023)   PRAPARE - Transportation  Lack of Transportation (Medical): No    Lack of Transportation (Non-Medical): No  Physical Activity: Sufficiently Active (10/24/2023)   Exercise Vital Sign    Days of Exercise per Week: 7 days    Minutes of Exercise per Session: 30 min  Stress: No Stress Concern Present (10/24/2023)   [employer] of Occupational Health - Occupational Stress Questionnaire    Feeling of Stress : Only a little  Social Connections: Moderately Isolated (10/24/2023)   Social Connection and Isolation Panel [NHANES]    Frequency of Communication with Friends and Family: Once a week    Frequency of Social Gatherings with Friends and Family: Once a week    Attends Religious Services: Never    Database Administrator or Organizations: Yes    Attends Engineer, Structural: More than 4 times per year    Marital Status: Married  Catering Manager Violence: Not on file    Review of  Systems:  All other review of systems negative except as mentioned in the HPI.  Physical Exam: Vital signs BP 121/72   Pulse (!) 58   Temp 97.7 F (36.5 C)   Resp 12   Ht 6' (1.829 m)   Wt 180 lb (81.6 kg)   SpO2 100%   BMI 24.41 kg/m   General:   Alert,  Well-developed, well-nourished, pleasant and cooperative in NAD Lungs:  Clear throughout to auscultation.   Heart:  Regular rate and rhythm; no murmurs, clicks, rubs,  or gallops. Abdomen:  Soft, nontender and nondistended. Normal bowel sounds.   Neuro/Psych:  Alert and cooperative. Normal mood and affect. A and O x 3   @Nishka Heide  CHARLENA Commander, MD, [facility] [phone] (pager) 12/03/2023 8:08 AM@

## 2023-12-03 ENCOUNTER — Encounter: Payer: Self-pay | Admitting: Internal Medicine

## 2023-12-03 ENCOUNTER — Ambulatory Visit: Payer: BC Managed Care – PPO | Admitting: Internal Medicine

## 2023-12-03 VITALS — BP 108/61 | HR 64 | Temp 97.7°F | Resp 11 | Ht 72.0 in | Wt 180.0 lb

## 2023-12-03 DIAGNOSIS — Z1211 Encounter for screening for malignant neoplasm of colon: Secondary | ICD-10-CM

## 2023-12-03 DIAGNOSIS — K5730 Diverticulosis of large intestine without perforation or abscess without bleeding: Secondary | ICD-10-CM

## 2023-12-03 DIAGNOSIS — D122 Benign neoplasm of ascending colon: Secondary | ICD-10-CM

## 2023-12-03 DIAGNOSIS — K635 Polyp of colon: Secondary | ICD-10-CM | POA: Diagnosis not present

## 2023-12-03 DIAGNOSIS — D123 Benign neoplasm of transverse colon: Secondary | ICD-10-CM

## 2023-12-03 DIAGNOSIS — D124 Benign neoplasm of descending colon: Secondary | ICD-10-CM | POA: Diagnosis not present

## 2023-12-03 MED ORDER — SODIUM CHLORIDE 0.9 % IV SOLN: 500.0000 mL | Freq: Once | INTRAVENOUS | Status: DC

## 2023-12-03 NOTE — Progress Notes (Signed)
 To pacu, VSS. Report to [email or web address].tb

## 2023-12-03 NOTE — Progress Notes (Signed)
 Called to room to assist during endoscopic procedure.  Patient ID and intended procedure confirmed with present staff. Received instructions for my participation in the procedure from the performing physician.

## 2023-12-03 NOTE — Patient Instructions (Addendum)
 I found and removed 2 small polyps that look benign.  I will let you know pathology results and when to have another routine colonoscopy by mail and/or My Chart.  You also have a condition called diverticulosis - common and not usually a problem. Please read the handout provided.  I appreciate the opportunity to care for you. Lupita CHARLENA Commander, MD, FACG   YOU HAD AN ENDOSCOPIC PROCEDURE TODAY AT THE [facility]:   Refer to the procedure report that was given to you for any specific questions about what was found during the examination.  If the procedure report does not answer your questions, please call your gastroenterologist to clarify.  If you requested that your care partner not be given the details of your procedure findings, then the procedure report has been included in a sealed envelope for you to review at your convenience later.  YOU SHOULD EXPECT: Some feelings of bloating in the abdomen. Passage of more gas than usual.  Walking can help get rid of the air that was put into your GI tract during the procedure and reduce the bloating. If you had a lower endoscopy (such as a colonoscopy or flexible sigmoidoscopy) you may notice spotting of blood in your stool or on the toilet paper. If you underwent a bowel prep for your procedure, you may not have a normal bowel movement for a few days.  Please Note:  You might notice some irritation and congestion in your nose or some drainage.  This is from the oxygen used during your procedure.  There is no need for concern and it should clear up in a day or so.  SYMPTOMS TO REPORT IMMEDIATELY:  Following lower endoscopy (colonoscopy or flexible sigmoidoscopy):  Excessive amounts of blood in the stool  Significant tenderness or worsening of abdominal pains  Swelling of the abdomen that is new, acute  Fever of 100F or higher  For urgent or emergent issues, a gastroenterologist can be reached at any hour by calling [phone]. Do  not use MyChart messaging for urgent concerns.    DIET:  We do recommend a small meal at first, but then you may proceed to your regular diet.  Drink plenty of fluids but you should avoid alcoholic beverages for 24 hours.  ACTIVITY:  You should plan to take it easy for the rest of today and you should NOT DRIVE or use heavy machinery until tomorrow (because of the sedation medicines used during the test).    FOLLOW UP: Our staff will call the number listed on your records the next business day following your procedure.  We will call around 7:15- 8:00 am to check on you and address any questions or concerns that you may have regarding the information given to you following your procedure. If we do not reach you, we will leave a message.     If any biopsies were taken you will be contacted by phone or by letter within the next 1-3 weeks.  Please call us  at [phone] if you have not heard about the biopsies in 3 weeks.    SIGNATURES/CONFIDENTIALITY: You and/or your care partner have signed paperwork which will be entered into your electronic medical record.  These signatures attest to the fact that that the information above on your After Visit Summary has been reviewed and is understood.  Full responsibility of the confidentiality of this discharge information lies with you and/or your care-partner.

## 2023-12-03 NOTE — Progress Notes (Signed)
 Pt's states no medical or surgical changes since previsit or office visit.

## 2023-12-03 NOTE — Op Note (Signed)
 [facility] Patient Name: Carlos [name] Procedure Date: 12/03/2023 8:01 AM MRN: [id number] Endoscopist: Lupita FORBES Commander , MD, [id number] Age: 63 Referring MD:  Date of Birth: 04-Apr-1961 Gender: Male Account #: [id number] Procedure:                Colonoscopy Indications:              Screening for colorectal malignant neoplasm, Last                            colonoscopy: 2014 Medicines:                Monitored Anesthesia Care Procedure:                Pre-Anesthesia Assessment:                           - Prior to the procedure, a History and Physical                            was performed, and patient medications and                            allergies were reviewed. The patient's tolerance of                            previous anesthesia was also reviewed. The risks                            and benefits of the procedure and the sedation                            options and risks were discussed with the patient.                            All questions were answered, and informed consent                            was obtained. Prior Anticoagulants: The patient has                            taken no anticoagulant or antiplatelet agents. ASA                            Grade Assessment: II - A patient with mild systemic                            disease. After reviewing the risks and benefits,                            the patient was deemed in satisfactory condition to                            undergo the procedure.  After obtaining informed consent, the colonoscope                            was passed under direct vision. Throughout the                            procedure, the patient's blood pressure, pulse, and                            oxygen saturations were monitored continuously. The                            CF [id number] #[id number] was introduced through the anus                            and advanced to the the cecum,  identified by                            appendiceal orifice and ileocecal valve. The                            colonoscopy was performed without difficulty. The                            patient tolerated the procedure well. The quality                            of the bowel preparation was good. The ileocecal                            valve, appendiceal orifice, and rectum were                            photographed. The bowel preparation used was SUPREP                            via split dose instruction. Scope In: 8:11:14 AM Scope Out: 8:34:27 AM Scope Withdrawal Time: 0 hours 19 minutes 24 seconds  Total Procedure Duration: 0 hours 23 minutes 13 seconds  Findings:                 The perianal and digital rectal examinations were                            normal.                           Two sessile polyps were found in the descending                            colon and ascending colon. The polyps were 1 to 8                            mm in size. These polyps were removed with a cold  snare. Resection and retrieval were complete.                            Verification of patient identification for the                            specimen was done. Estimated blood loss was minimal.                           Multiple diverticula were found in the sigmoid                            colon.                           The exam was otherwise without abnormality on                            direct and retroflexion views. Complications:            No immediate complications. Estimated Blood Loss:     Estimated blood loss was minimal. Impression:               - Two 1 to 8 mm polyps in the descending colon and                            in the ascending colon, removed with a cold snare.                            Resected and retrieved.                           - Diverticulosis in the sigmoid colon.                           - The examination was  otherwise normal on direct                            and retroflexion views. Recommendation:           - Patient has a contact number available for                            emergencies. The signs and symptoms of potential                            delayed complications were discussed with the                            patient. Return to normal activities tomorrow.                            Written discharge instructions were provided to the                            patient.                           -  Resume previous diet.                           - Continue present medications.                           - Await pathology results.                           - Repeat colonoscopy is recommended. The                            colonoscopy date will be determined after pathology                            results from today's exam become available for                            review. Lupita FORBES Commander, MD 12/03/2023 8:41:51 AM This report has been signed electronically.

## 2023-12-04 ENCOUNTER — Telehealth: Payer: Self-pay | Admitting: *Deleted

## 2023-12-04 NOTE — Telephone Encounter (Signed)
 Post procedure follow up call placed, no answer and left VM.

## 2023-12-05 ENCOUNTER — Encounter: Payer: Self-pay | Admitting: Internal Medicine

## 2023-12-05 DIAGNOSIS — Z860101 Personal history of adenomatous and serrated colon polyps: Secondary | ICD-10-CM | POA: Insufficient documentation

## 2023-12-05 HISTORY — DX: Personal history of adenomatous and serrated colon polyps: Z86.0101

## 2023-12-05 LAB — SURGICAL PATHOLOGY

## 2023-12-18 ENCOUNTER — Other Ambulatory Visit: Payer: Self-pay | Admitting: Family Medicine

## 2024-01-28 DIAGNOSIS — M9902 Segmental and somatic dysfunction of thoracic region: Secondary | ICD-10-CM | POA: Diagnosis not present

## 2024-01-28 DIAGNOSIS — M62838 Other muscle spasm: Secondary | ICD-10-CM | POA: Diagnosis not present

## 2024-01-28 DIAGNOSIS — M542 Cervicalgia: Secondary | ICD-10-CM | POA: Diagnosis not present

## 2024-01-28 DIAGNOSIS — M9901 Segmental and somatic dysfunction of cervical region: Secondary | ICD-10-CM | POA: Diagnosis not present

## 2024-01-30 DIAGNOSIS — M62838 Other muscle spasm: Secondary | ICD-10-CM | POA: Diagnosis not present

## 2024-01-30 DIAGNOSIS — M9902 Segmental and somatic dysfunction of thoracic region: Secondary | ICD-10-CM | POA: Diagnosis not present

## 2024-01-30 DIAGNOSIS — M542 Cervicalgia: Secondary | ICD-10-CM | POA: Diagnosis not present

## 2024-01-30 DIAGNOSIS — M9901 Segmental and somatic dysfunction of cervical region: Secondary | ICD-10-CM | POA: Diagnosis not present

## 2024-02-03 DIAGNOSIS — M542 Cervicalgia: Secondary | ICD-10-CM | POA: Diagnosis not present

## 2024-02-03 DIAGNOSIS — M62838 Other muscle spasm: Secondary | ICD-10-CM | POA: Diagnosis not present

## 2024-02-03 DIAGNOSIS — M9902 Segmental and somatic dysfunction of thoracic region: Secondary | ICD-10-CM | POA: Diagnosis not present

## 2024-02-03 DIAGNOSIS — M9901 Segmental and somatic dysfunction of cervical region: Secondary | ICD-10-CM | POA: Diagnosis not present

## 2024-02-05 DIAGNOSIS — M542 Cervicalgia: Secondary | ICD-10-CM | POA: Diagnosis not present

## 2024-02-05 DIAGNOSIS — M62838 Other muscle spasm: Secondary | ICD-10-CM | POA: Diagnosis not present

## 2024-02-05 DIAGNOSIS — M9902 Segmental and somatic dysfunction of thoracic region: Secondary | ICD-10-CM | POA: Diagnosis not present

## 2024-02-05 DIAGNOSIS — M9901 Segmental and somatic dysfunction of cervical region: Secondary | ICD-10-CM | POA: Diagnosis not present

## 2024-02-06 DIAGNOSIS — M9901 Segmental and somatic dysfunction of cervical region: Secondary | ICD-10-CM | POA: Diagnosis not present

## 2024-02-06 DIAGNOSIS — M9902 Segmental and somatic dysfunction of thoracic region: Secondary | ICD-10-CM | POA: Diagnosis not present

## 2024-02-06 DIAGNOSIS — M62838 Other muscle spasm: Secondary | ICD-10-CM | POA: Diagnosis not present

## 2024-02-06 DIAGNOSIS — M542 Cervicalgia: Secondary | ICD-10-CM | POA: Diagnosis not present

## 2024-02-10 DIAGNOSIS — M9902 Segmental and somatic dysfunction of thoracic region: Secondary | ICD-10-CM | POA: Diagnosis not present

## 2024-02-10 DIAGNOSIS — M62838 Other muscle spasm: Secondary | ICD-10-CM | POA: Diagnosis not present

## 2024-02-10 DIAGNOSIS — M9901 Segmental and somatic dysfunction of cervical region: Secondary | ICD-10-CM | POA: Diagnosis not present

## 2024-02-10 DIAGNOSIS — M542 Cervicalgia: Secondary | ICD-10-CM | POA: Diagnosis not present

## 2024-02-12 DIAGNOSIS — M542 Cervicalgia: Secondary | ICD-10-CM | POA: Diagnosis not present

## 2024-02-12 DIAGNOSIS — M62838 Other muscle spasm: Secondary | ICD-10-CM | POA: Diagnosis not present

## 2024-02-12 DIAGNOSIS — M9901 Segmental and somatic dysfunction of cervical region: Secondary | ICD-10-CM | POA: Diagnosis not present

## 2024-02-12 DIAGNOSIS — M9902 Segmental and somatic dysfunction of thoracic region: Secondary | ICD-10-CM | POA: Diagnosis not present

## 2024-02-13 DIAGNOSIS — M9901 Segmental and somatic dysfunction of cervical region: Secondary | ICD-10-CM | POA: Diagnosis not present

## 2024-02-13 DIAGNOSIS — M542 Cervicalgia: Secondary | ICD-10-CM | POA: Diagnosis not present

## 2024-02-13 DIAGNOSIS — M9902 Segmental and somatic dysfunction of thoracic region: Secondary | ICD-10-CM | POA: Diagnosis not present

## 2024-02-13 DIAGNOSIS — M62838 Other muscle spasm: Secondary | ICD-10-CM | POA: Diagnosis not present

## 2024-02-17 DIAGNOSIS — M9902 Segmental and somatic dysfunction of thoracic region: Secondary | ICD-10-CM | POA: Diagnosis not present

## 2024-02-17 DIAGNOSIS — M9901 Segmental and somatic dysfunction of cervical region: Secondary | ICD-10-CM | POA: Diagnosis not present

## 2024-02-17 DIAGNOSIS — M542 Cervicalgia: Secondary | ICD-10-CM | POA: Diagnosis not present

## 2024-02-17 DIAGNOSIS — M62830 Muscle spasm of back: Secondary | ICD-10-CM | POA: Diagnosis not present

## 2024-02-17 DIAGNOSIS — M62838 Other muscle spasm: Secondary | ICD-10-CM | POA: Diagnosis not present

## 2024-06-05 ENCOUNTER — Ambulatory Visit: Payer: Self-pay

## 2024-06-05 ENCOUNTER — Other Ambulatory Visit: Payer: Self-pay | Admitting: Family Medicine

## 2024-06-05 MED ORDER — PREDNISONE 20 MG PO TABS: ORAL_TABLET | ORAL | 0 refills | Status: AC

## 2024-06-05 NOTE — Telephone Encounter (Signed)
 My chart message sent w/ provider information and medication instructions. Also phone call made and vm left for call back if needed.

## 2024-06-05 NOTE — Telephone Encounter (Signed)
 FYI Only or Action Required?: FYI only for provider.  Patient was last seen in primary care on 10/25/2023 by Duanne Butler DASEN, MD.  Called Nurse Triage reporting Insect Bite.  Symptoms began yesterday.  Interventions attempted: OTC medications: triple antibiotic and Rest, hydration, or home remedies.  Symptoms are: unchanged.  Triage Disposition: Home Care  Patient/caregiver understands and will follow disposition?: Yes         Copied from CRM [id number]. Topic: Clinical - Red Word Triage >> Jun 05, 2024 11:38 AM Kevelyn M wrote: Red Word that prompted transfer to Nurse Triage: Stung by a hornet yesterday morning on right hand and now right hand starting to swell. Reason for Disposition  Normal local reaction to bee, wasp, or yellow jacket sting  Answer Assessment - Initial Assessment Questions 1. TYPE: What type of sting was it? (e.g., bee, yellow jacket, unknown)      hornet 2. ONSET: When did it occur?      yesterday 3. LOCATION: Where is the sting located?  How many stings?     Right hand and one string 4. SWELLING SIZE: How big is the swelling? (e.g., inches or cm)     Slightly swollen but seems to be spreading from knuckle to the joint 5. REDNESS: Is the area red or pink? If Yes, ask: What size is area of redness? (e.g., inches or cm). When did the redness start?     Red area about 80 %  6. PAIN: Is there any pain? If Yes, ask: How bad is it?  (Scale 0-10; or none, mild, moderate, severe)     none 7. ITCHING: Is there any itching? If Yes, ask: How bad is it?      itching 8. RESPIRATORY DISTRESS: Describe your breathing.     no 9. PRIOR REACTIONS: Have you had any severe allergic reactions to stings in the past? If Yes, ask: What happened?     Yes, wasps sting 10. OTHER SYMPTOMS: Do you have any other symptoms? (e.g., abdomen pain, face or tongue swelling, new rash elsewhere, vomiting)       no  Protocols used: Bee or Yellow Jacket  Sting-A-AH

## 2024-10-21 ENCOUNTER — Other Ambulatory Visit

## 2024-10-21 DIAGNOSIS — C61 Malignant neoplasm of prostate: Secondary | ICD-10-CM | POA: Diagnosis not present

## 2024-10-21 DIAGNOSIS — I10 Essential (primary) hypertension: Secondary | ICD-10-CM | POA: Diagnosis not present

## 2024-10-21 DIAGNOSIS — Z0000 Encounter for general adult medical examination without abnormal findings: Secondary | ICD-10-CM

## 2024-10-21 DIAGNOSIS — Z13220 Encounter for screening for lipoid disorders: Secondary | ICD-10-CM

## 2024-10-21 DIAGNOSIS — R739 Hyperglycemia, unspecified: Secondary | ICD-10-CM | POA: Diagnosis not present

## 2024-10-22 ENCOUNTER — Ambulatory Visit: Payer: Self-pay | Admitting: Family Medicine

## 2024-10-22 LAB — CBC WITH DIFFERENTIAL/PLATELET
Absolute Lymphocytes: 2129 {cells}/uL (ref 850–3900)
Absolute Monocytes: 644 {cells}/uL (ref 200–950)
Basophils Absolute: 52 {cells}/uL (ref 0–200)
Basophils Relative: 0.9 %
Eosinophils Absolute: 400 {cells}/uL (ref 15–500)
Eosinophils Relative: 6.9 %
HCT: 45.8 % (ref 39.4–51.1)
Hemoglobin: 15.1 g/dL (ref 13.2–17.1)
MCH: 29.7 pg (ref 27.0–33.0)
MCHC: 33 g/dL (ref 31.6–35.4)
MCV: 90 fL (ref 81.4–101.7)
MPV: 9.7 fL (ref 7.5–12.5)
Monocytes Relative: 11.1 %
Neutro Abs: 2575 {cells}/uL (ref 1500–7800)
Neutrophils Relative %: 44.4 %
Platelets: 269 Thousand/uL (ref 140–400)
RBC: 5.09 Million/uL (ref 4.20–5.80)
RDW: 13.1 % (ref 11.0–15.0)
Total Lymphocyte: 36.7 %
WBC: 5.8 Thousand/uL (ref 3.8–10.8)

## 2024-10-22 LAB — LIPID PANEL
Cholesterol: 191 mg/dL (ref <200)
HDL: 66 mg/dL (ref >=40)
LDL Cholesterol (Calc): 111 mg/dL — ABNORMAL HIGH
Non-HDL Cholesterol (Calc): 125 mg/dL (ref <130)
Total CHOL/HDL Ratio: 2.9 (calc) (ref <5.0)
Triglycerides: 54 mg/dL (ref <150)

## 2024-10-22 LAB — COMPLETE METABOLIC PANEL WITHOUT GFR
AG Ratio: 1.8 (calc) (ref 1.0–2.5)
ALT: 17 U/L (ref 9–46)
AST: 20 U/L (ref 10–35)
Albumin: 4.4 g/dL (ref 3.6–5.1)
Alkaline phosphatase (APISO): 32 U/L — ABNORMAL LOW (ref 35–144)
BUN: 19 mg/dL (ref 7–25)
CO2: 29 mmol/L (ref 20–32)
Calcium: 9.5 mg/dL (ref 8.6–10.3)
Chloride: 104 mmol/L (ref 98–110)
Creat: 0.94 mg/dL (ref 0.70–1.35)
Globulin: 2.4 g/dL (ref 1.9–3.7)
Glucose, Bld: 106 mg/dL — ABNORMAL HIGH (ref 65–99)
Potassium: 4.5 mmol/L (ref 3.5–5.3)
Sodium: 141 mmol/L (ref 135–146)
Total Bilirubin: 0.5 mg/dL (ref 0.2–1.2)
Total Protein: 6.8 g/dL (ref 6.1–8.1)

## 2024-10-22 LAB — HEMOGLOBIN A1C
Hgb A1c MFr Bld: 5.8 % — ABNORMAL HIGH (ref <5.7)
Mean Plasma Glucose: 120 mg/dL
eAG (mmol/L): 6.6 mmol/L

## 2024-10-22 LAB — PSA: PSA: 3.61 ng/mL (ref <=4.00)

## 2024-10-23 ENCOUNTER — Other Ambulatory Visit: Payer: BC Managed Care – PPO

## 2024-10-26 ENCOUNTER — Encounter: Payer: Self-pay | Admitting: Family Medicine

## 2024-10-26 ENCOUNTER — Ambulatory Visit: Payer: BC Managed Care – PPO | Admitting: Family Medicine

## 2024-10-26 VITALS — BP 118/76 | HR 72 | Temp 97.9°F | Ht 72.0 in | Wt 183.0 lb

## 2024-10-26 DIAGNOSIS — R972 Elevated prostate specific antigen [PSA]: Secondary | ICD-10-CM | POA: Diagnosis not present

## 2024-10-26 DIAGNOSIS — Z0001 Encounter for general adult medical examination with abnormal findings: Secondary | ICD-10-CM | POA: Diagnosis not present

## 2024-10-26 DIAGNOSIS — Z9189 Other specified personal risk factors, not elsewhere classified: Secondary | ICD-10-CM | POA: Diagnosis not present

## 2024-10-26 DIAGNOSIS — Z23 Encounter for immunization: Secondary | ICD-10-CM

## 2024-10-26 DIAGNOSIS — Z0000 Encounter for general adult medical examination without abnormal findings: Secondary | ICD-10-CM

## 2024-10-26 NOTE — Progress Notes (Signed)
 The 10-year ASCVD risk score (Arnett DK, et al., 2019) is: 8.2%   Values used to calculate the score:     Age: 63 years     Clincally relevant sex: Male     Is Non-Hispanic African American: No     Diabetic: No     Tobacco smoker: No     Systolic Blood Pressure: 118 mmHg     Is BP treated: Yes     HDL Cholesterol: 66 mg/dL     Total Cholesterol: 191 mg/dL   Subjective:    Patient ID: Carlos [name], male    DOB: 1961/03/20, 24 y.o.   MRN: [id number]  HPI Patient is here today for complete physical exam.  He had his colonoscopy earlier this year.  They did find a tubular adenoma.  They recommended a repeat colonoscopy in 5 years.  His PSA was recently checked and found to be 3.6.  It was 3.2 last year.  Patient is averaging an increase of 0.4 and his PSA per year over the last 4 years.  This has been very steady and consistent.  He denies any lower urinary tract symptoms.  Specifically he denies any hesitancy or weak stream or burning with urination.  I recommended monitoring his PSA more closely such as every 6 months.  He is due for pneumonia vaccine, a flu shot, COVID shot, an RSV vaccine, and a tetanus shot.  He agrees to receive a tetanus shot today as well as Prevnar 20.  He declines the other vaccinations at the present time.  His most recent lab work as listed below.  His calculated [place] risk is 8% Lab on 10/21/2024  Component Date Value Ref Range Status   WBC 10/21/2024 5.8  3.8 - 10.8 Thousand/uL Final   RBC 10/21/2024 5.09  4.20 - 5.80 Million/uL Final   Hemoglobin 10/21/2024 15.1  13.2 - 17.1 g/dL Final   HCT 87/96/7974 45.8  39.4 - 51.1 % Final   MCV 10/21/2024 90.0  81.4 - 101.7 fL Final   MCH 10/21/2024 29.7  27.0 - 33.0 pg Final   MCHC 10/21/2024 33.0  31.6 - 35.4 g/dL Final   Comment: For adults, a slight decrease in the calculated MCHC value (in the range of 30 to 32 g/dL) is most likely not clinically significant; however, it should be interpreted with caution in  correlation with other red cell parameters and the patient's clinical condition.    RDW 10/21/2024 13.1  11.0 - 15.0 % Final   Platelets 10/21/2024 269  140 - 400 Thousand/uL Final   MPV 10/21/2024 9.7  7.5 - 12.5 fL Final   Neutro Abs 10/21/2024 2,575  1,500 - 7,800 cells/uL Final   Absolute Lymphocytes 10/21/2024 2,129  850 - 3,900 cells/uL Final   Absolute Monocytes 10/21/2024 644  200 - 950 cells/uL Final   Eosinophils Absolute 10/21/2024 400  15 - 500 cells/uL Final   Basophils Absolute 10/21/2024 52  0 - 200 cells/uL Final   Neutrophils Relative % 10/21/2024 44.4  % Final   Total Lymphocyte 10/21/2024 36.7  % Final   Monocytes Relative 10/21/2024 11.1  % Final   Eosinophils Relative 10/21/2024 6.9  % Final   Basophils Relative 10/21/2024 0.9  % Final   Glucose, Bld 10/21/2024 106 (H)  65 - 99 mg/dL Final   Comment: .            Fasting reference interval . For someone without known diabetes, a glucose value between 100 and 125  mg/dL is consistent with prediabetes and should be confirmed with a follow-up test. .    BUN 10/21/2024 19  7 - 25 mg/dL Final   Creat 87/96/7974 0.94  0.70 - 1.35 mg/dL Final   BUN/Creatinine Ratio 10/21/2024 SEE NOTE:  6 - 22 (calc) Final   Comment:    Not Reported: BUN and Creatinine are within    reference range. .    Sodium 10/21/2024 141  135 - 146 mmol/L Final   Potassium 10/21/2024 4.5  3.5 - 5.3 mmol/L Final   Chloride 10/21/2024 104  98 - 110 mmol/L Final   CO2 10/21/2024 29  20 - 32 mmol/L Final   Calcium 10/21/2024 9.5  8.6 - 10.3 mg/dL Final   Total Protein 87/96/7974 6.8  6.1 - 8.1 g/dL Final   Albumin 87/96/7974 4.4  3.6 - 5.1 g/dL Final   Globulin 87/96/7974 2.4  1.9 - 3.7 g/dL (calc) Final   AG Ratio 10/21/2024 1.8  1.0 - 2.5 (calc) Final   Total Bilirubin 10/21/2024 0.5  0.2 - 1.2 mg/dL Final   Alkaline phosphatase (APISO) 10/21/2024 32 (L)  35 - 144 U/L Final   AST 10/21/2024 20  10 - 35 U/L Final   ALT 10/21/2024 17  9 -  46 U/L Final   Hgb A1c MFr Bld 10/21/2024 5.8 (H)  <5.7 % Final   Comment: For someone without known diabetes, a hemoglobin  A1c value between 5.7% and 6.4% is consistent with prediabetes and should be confirmed with a  follow-up test. . For someone with known diabetes, a value <7% indicates that their diabetes is well controlled. A1c targets should be individualized based on duration of diabetes, age, comorbid conditions, and other considerations. . This assay result is consistent with an increased risk of diabetes. . Currently, no consensus exists regarding use of hemoglobin A1c for diagnosis of diabetes for children. .    Mean Plasma Glucose 10/21/2024 120  mg/dL Final   eAG (mmol/L) 87/96/7974 6.6  mmol/L Final   Cholesterol 10/21/2024 191  <200 mg/dL Final   HDL 87/96/7974 66  > OR = 40 mg/dL Final   Triglycerides 87/96/7974 54  <150 mg/dL Final   LDL Cholesterol (Calc) 10/21/2024 111 (H)  mg/dL (calc) Final   Comment: Reference range: <100 . Desirable range <100 mg/dL for primary prevention;   <70 mg/dL for patients with CHD or diabetic patients  with > or = 2 CHD risk factors. SABRA LDL-C is now calculated using the Martin-Hopkins  calculation, which is a validated novel method providing  better accuracy than the Friedewald equation in the  estimation of LDL-C.  Gladis APPLETHWAITE et al. SANDREA. 7986;689(80): 2061-2068  (http://education.[facility].com/faq/FAQ164)    Total CHOL/HDL Ratio 10/21/2024 2.9  <4.9 (calc) Final   Non-HDL Cholesterol (Calc) 10/21/2024 125  <130 mg/dL (calc) Final   Comment: For patients with diabetes plus 1 major ASCVD risk  factor, treating to a non-HDL-C goal of <100 mg/dL  (LDL-C of <29 mg/dL) is considered a therapeutic  option.    PSA 10/21/2024 3.61  < OR = 4.00 ng/mL Final   Comment: The total PSA value from this assay system is  standardized against the WHO standard. The test  result will be approximately 20% lower when compared  to  the equimolar-standardized total PSA (Beckman  Coulter). Comparison of serial PSA results should be  interpreted with this fact in mind. . This test was performed using the Siemens  chemiluminescent method. Values obtained from  different  assay methods cannot be used interchangeably. PSA levels, regardless of value, should not be interpreted as absolute evidence of the presence or absence of disease.    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 30 tablet 3   Multiple Vitamin (MULTIVITAMIN) tablet Take 1 tablet by mouth daily.     TURMERIC PO Take by mouth.     valsartan  (DIOVAN ) 160 MG tablet TAKE 1 TABLET BY MOUTH EVERY DAY 90 tablet 3   predniSONE  (DELTASONE ) 20 MG tablet 3 tabs poqday 1-2, 2 tabs poqday 3-4, 1 tab poqday 5-6 (Patient not taking: Reported on 10/26/2024) 12 tablet 0   No current facility-administered medications on file prior to visit.   Allergies  Allergen Reactions   Bee Venom    Social History   Socioeconomic History   Marital status: Married    Spouse name: Not on file   Number of children: Not on file   Years of education: Not on file   Highest education level: Associate degree: occupational, scientist, product/process development, or vocational program  Occupational History   Not on file  Tobacco Use   Smoking status: Never   Smokeless tobacco: Never  Substance and Sexual Activity   Alcohol use: Yes    Comment: Occasional   Drug use: No   Sexual activity: Yes  Other Topics Concern   Not on file  Social History Narrative   Not on file   Social  Drivers of Health   Financial Resource Strain: Low Risk  (10/23/2024)   Overall Financial Resource Strain (CARDIA)    Difficulty of Paying Living Expenses: Not very hard  Food Insecurity: No Food Insecurity (10/23/2024)   Hunger Vital Sign    Worried About Running Out of Food in the Last Year: Never true    Ran Out of Food in the Last Year: Never true  Transportation Needs: No Transportation Needs (10/23/2024)   PRAPARE - Administrator, Civil Service (Medical): No    Lack of Transportation (Non-Medical): No  Physical Activity: Inactive (10/23/2024)   Exercise Vital Sign    Days of Exercise per Week: 0 days    Minutes of Exercise per Session: Not on file  Stress: No Stress Concern Present (10/23/2024)   [employer] of Occupational Health - Occupational Stress Questionnaire    Feeling of Stress: Not at all  Social Connections: Socially Isolated (10/23/2024)   Social Connection and Isolation Panel    Frequency of Communication with Friends and Family: Once a week    Frequency of Social Gatherings with Friends and Family: Once a week    Attends Religious Services: Patient declined    Database Administrator or Organizations: No    Attends Engineer, Structural: Not on file    Marital Status: Married  Catering Manager Violence: Not on file   Family History  Problem Relation Age of Onset   Diabetes Mother    Vision loss Father    Cancer Father 11  prostate   Cancer Maternal Grandfather    Colon cancer Neg Hx    Colon polyps Neg Hx    Esophageal cancer Neg Hx    Rectal cancer Neg Hx    Stomach cancer Neg Hx       Review of Systems  All other systems reviewed and are negative.      Objective:   Physical Exam Vitals reviewed.  Constitutional:      General: He is not in acute distress.    Appearance: Normal appearance. He is well-developed and normal weight. He is not diaphoretic.  HENT:     Head: Normocephalic and atraumatic.     Right Ear:  Tympanic membrane, ear canal and external ear normal.     Left Ear: Tympanic membrane, ear canal and external ear normal.     Nose: Nose normal. No congestion or rhinorrhea.     Mouth/Throat:     Pharynx: No oropharyngeal exudate or posterior oropharyngeal erythema.  Eyes:     General: No scleral icterus.       Right eye: No discharge.        Left eye: No discharge.     Conjunctiva/sclera: Conjunctivae normal.     Pupils: Pupils are equal, round, and reactive to light.  Neck:     Thyroid: No thyromegaly.     Vascular: No carotid bruit or JVD.     Trachea: No tracheal deviation.  Cardiovascular:     Rate and Rhythm: Normal rate and regular rhythm.     Pulses: Normal pulses.     Heart sounds: Normal heart sounds. No murmur heard.    No friction rub. No gallop.  Pulmonary:     Effort: Pulmonary effort is normal. No respiratory distress.     Breath sounds: Normal breath sounds. No stridor. No wheezing, rhonchi or rales.  Chest:     Chest wall: No tenderness.  Abdominal:     General: Bowel sounds are normal. There is no distension.     Palpations: Abdomen is soft. There is no mass.     Tenderness: There is no abdominal tenderness. There is no guarding or rebound.  Musculoskeletal:     Cervical back: Normal range of motion and neck supple. No rigidity or tenderness.  Lymphadenopathy:     Cervical: No cervical adenopathy.  Skin:    General: Skin is warm.     Coloration: Skin is not jaundiced or pale.     Findings: No bruising, erythema, lesion or rash.  Neurological:     General: No focal deficit present.     Mental Status: He is alert and oriented to person, place, and time. Mental status is at baseline.     Cranial Nerves: No cranial nerve deficit.     Sensory: No sensory deficit.     Motor: No weakness or abnormal muscle tone.     Coordination: Coordination normal.     Gait: Gait normal.     Deep Tendon Reflexes: Reflexes are normal and symmetric. Reflexes normal.   Psychiatric:        Behavior: Behavior normal.        Thought Content: Thought content normal.        Judgment: Judgment normal.        Assessment & Plan:  Need for vaccination - Plan: Tdap vaccine greater than or equal to 7yo IM, Pneumococcal conjugate vaccine 20-valent (Prevnar 20)  Routine general medical examination at a health care facility  [place] cardiac risk <10% in  next 10 years  Elevated PSA Patient received his tetanus vaccine today as well as his Prevnar 20.  Blood pressure is outstanding.  Patient has mild elevation in his sugars but certainly not to the extent that he requires medication.  His 10-year cardiac risk is less than 10%.  He declines a statin.  We did discuss a coronary artery calcium score as a means to more accurately gauge his cardiovascular risk.  He will consider this and let me know of his decision.  I did recommend checking his PSA again in 6 months to monitor this more closely.  The remainder of his lab work is outstanding
